# Patient Record
Sex: Female | Born: 2000 | Race: Black or African American | Hispanic: No | Marital: Single | Smoking: Never smoker
Health system: Southern US, Community
[De-identification: ages and names within clinical notes are randomized; demographics above are authoritative.]

## PROBLEM LIST (undated history)

## (undated) ENCOUNTER — Ambulatory Visit: Admission: EM | Payer: No Typology Code available for payment source | Source: Home / Self Care

## (undated) DIAGNOSIS — A499 Bacterial infection, unspecified: Secondary | ICD-10-CM

## (undated) DIAGNOSIS — Z789 Other specified health status: Secondary | ICD-10-CM

---
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
## 2020-10-31 ENCOUNTER — Other Ambulatory Visit: Payer: Self-pay

## 2020-11-07 ENCOUNTER — Encounter (HOSPITAL_COMMUNITY): Payer: Self-pay

## 2020-11-07 ENCOUNTER — Ambulatory Visit (HOSPITAL_COMMUNITY)
Admission: EM | Admit: 2020-11-07 | Discharge: 2020-11-07 | Disposition: A | Discharge status: Home / Self Care | Payer: Self-pay | Attending: Emergency Medicine | Admitting: Emergency Medicine

## 2020-11-07 ENCOUNTER — Other Ambulatory Visit: Payer: Self-pay

## 2020-11-07 DIAGNOSIS — R059 Cough, unspecified: Secondary | ICD-10-CM

## 2020-11-07 MED ORDER — ALBUTEROL SULFATE HFA 108 (90 BASE) MCG/ACT IN AERS
INHALATION_SPRAY | RESPIRATORY_TRACT | Status: AC
  Filled 2020-11-07: qty 6.7

## 2020-11-07 MED ORDER — ALBUTEROL SULFATE HFA 108 (90 BASE) MCG/ACT IN AERS
2.0000 | INHALATION_SPRAY | Freq: Once | RESPIRATORY_TRACT | Status: AC
  Administered 2020-11-07: 2 via RESPIRATORY_TRACT

## 2020-11-07 MED ORDER — BENZONATATE 100 MG PO CAPS: 100.0000 mg | ORAL_CAPSULE | Freq: Three times a day (TID) | ORAL | 0 refills | Status: DC | PRN

## 2020-11-07 MED ORDER — PREDNISONE 20 MG PO TABS: 40.0000 mg | ORAL_TABLET | Freq: Every day | ORAL | 0 refills | Status: AC

## 2020-11-07 NOTE — ED Provider Notes (Signed)
[facility]    CSN: [id number] Arrival date & time: 11/07/20  2979      History   Chief Complaint Chief Complaint  Patient presents with  . Cough    HPI Rebecca Gray is a 20 y.o. female.   Rebecca Gray presents with complaints of cough. She started with symptoms around 4/18 with cough and congestion, after being around a friend with similar illness. Congestion has improved but still with a cough which worsens in the afternoon/ evening. No fevers. No shortness of breath. Cough has been improving. Cough is now dry. No ear pain or sore throat. Has been using theraflu and another over the counter medication for her cough. Denies any previous similar. No history of asthma. Doesn't smoke.     ROS per HPI, negative if not otherwise mentioned.      Past Medical History:  Diagnosis Date  . Bacterial infection    in her blood  . Medical history non-contributory     There are no problems to display for this patient.   History reviewed. No pertinent surgical history.  OB History   No obstetric history on file.      Home Medications    Prior to Admission medications   Medication Sig Start Date End Date Taking? Authorizing Provider  benzonatate (TESSALON) 100 MG capsule Take 1-2 capsules (100-200 mg total) by mouth 3 (three) times daily as needed for cough. 11/07/20  Yes Aylee Littrell, Dorene Grebe B, NP  predniSONE (DELTASONE) 20 MG tablet Take 2 tablets (40 mg total) by mouth daily with breakfast for 5 days. 11/07/20 11/12/20 Yes Negin Hegg, Dorene Grebe B, NP  Cetirizine HCl 10 MG CAPS Take 1 capsule (10 mg total) by mouth daily for 15 days. 11/02/17 11/17/17  Wieters, Hallie C, PA-C  cetirizine-pseudoephedrine (ZYRTEC-D) 5-120 MG per tablet Take 1 tablet by mouth daily. 02/25/13   Gwyneth Sprout, MD  DiphenhydrAMINE HCl (BENADRYL ALLERGY PO) Take 1 tablet by mouth daily as needed (allergies).    [provider]  fluticasone (FLONASE) 50 MCG/ACT nasal spray Place 1 spray  into both nostrils daily for 7 days. 02/13/18 02/20/18  Linus Mako B, NP  ipratropium (ATROVENT) 0.06 % nasal spray Place 2 sprays into both nostrils 4 (four) times daily. 02/13/18   Georgetta Haber, NP  omeprazole (PRILOSEC) 20 MG capsule Take 1 capsule (20 mg total) by mouth daily. 07/18/19   Wieters, Hallie C, PA-C  ondansetron (ZOFRAN ODT) 4 MG disintegrating tablet Take 1 tablet (4 mg total) by mouth every 8 (eight) hours as needed for nausea or vomiting. 07/18/19   Wieters, Junius Creamer, PA-C    Family History History reviewed. No pertinent family history.  Social History Social History   Tobacco Use  . Smoking status: Passive Smoke Exposure - Never Smoker  . Smokeless tobacco: Never Used  Substance Use Topics  . Alcohol use: No  . Drug use: No     Allergies   Patient has no known allergies.   Review of Systems Review of Systems   Physical Exam Triage Vital Signs ED Triage Vitals  Enc Vitals Group     BP 11/07/20 1029 106/69     Pulse Rate 11/07/20 1029 76     Resp 11/07/20 1029 17     Temp 11/07/20 1029 98.1 F (36.7 C)     Temp Source 11/07/20 1029 Oral     SpO2 11/07/20 1029 100 %     Weight --      Height --  Head Circumference --      Peak Flow --      Pain Score 11/07/20 1030 2     Pain Loc --      Pain Edu? --      Excl. in GC? --    No data found.  Updated Vital Signs BP 106/69 (BP Location: Right Arm)   Pulse 76   Temp 98.1 F (36.7 C) (Oral)   Resp 17   LMP 10/26/2020   SpO2 100%   Visual Acuity Right Eye Distance:   Left Eye Distance:   Bilateral Distance:    Right Eye Near:   Left Eye Near:    Bilateral Near:     Physical Exam Constitutional:      General: She is not in acute distress.    Appearance: She is well-developed.  HENT:     Right Ear: Tympanic membrane and ear canal normal.     Left Ear: Tympanic membrane and ear canal normal.     Nose: Nose normal.     Mouth/Throat:     Mouth: Mucous membranes are moist.   Eyes:     Conjunctiva/sclera: Conjunctivae normal.     Pupils: Pupils are equal, round, and reactive to light.  Cardiovascular:     Rate and Rhythm: Normal rate.  Pulmonary:     Effort: Pulmonary effort is normal.     Breath sounds: Normal breath sounds.  Skin:    General: Skin is warm and dry.  Neurological:     Mental Status: She is alert and oriented to person, place, and time.      UC Treatments / Results  Labs (all labs ordered are listed, but only abnormal results are displayed) Labs Reviewed - No data to display  EKG   Radiology No results found.  Procedures Procedures (including critical care time)  Medications Ordered in UC Medications  albuterol (VENTOLIN HFA) 108 (90 Base) MCG/ACT inhaler 2 puff (has no administration in time range)    Initial Impression / Assessment and Plan / UC Course  I have reviewed the triage vital signs and the nursing notes.  Pertinent labs & imaging results that were available during my care of the patient were reviewed by me and considered in my medical decision making (see chart for details).     Non toxic. Benign physical exam.  Lungs clear here today. Vitals stable. No work of breathing. History and physical consistent with viral illness.  Supportive cares recommended. Return precautions provided. Patient verbalized understanding and agreeable to plan.   Final Clinical Impressions(s) / UC Diagnoses   Final diagnoses:  Cough     Discharge Instructions     Use of inhaler as needed for wheezing or shortness of breath.   Delsym may help with your cough, tessalon as needed may further help with your cough.  5 days of prednisone.  If symptoms worsen or do not improve in the next week to return to be seen or to follow up with your PCP.     ED Prescriptions    Medication Sig Dispense Auth. Provider   predniSONE (DELTASONE) 20 MG tablet Take 2 tablets (40 mg total) by mouth daily with breakfast for 5 days. 10 tablet Linus Mako B, NP   benzonatate (TESSALON) 100 MG capsule Take 1-2 capsules (100-200 mg total) by mouth 3 (three) times daily as needed for cough. 21 capsule Georgetta Haber, NP     PDMP not reviewed this encounter.   Linus Mako  B, NP 11/07/20 1047

## 2020-11-07 NOTE — Discharge Instructions (Addendum)
Use of inhaler as needed for wheezing or shortness of breath.   Delsym may help with your cough, tessalon as needed may further help with your cough.  5 days of prednisone.  If symptoms worsen or do not improve in the next week to return to be seen or to follow up with your PCP.

## 2020-11-07 NOTE — ED Triage Notes (Signed)
Pt presents with non productive cough and congestion X 3 weeks. 

## 2020-12-19 ENCOUNTER — Other Ambulatory Visit: Payer: Self-pay

## 2020-12-19 ENCOUNTER — Ambulatory Visit (HOSPITAL_COMMUNITY)
Admission: EM | Admit: 2020-12-19 | Discharge: 2020-12-19 | Disposition: A | Discharge status: Home / Self Care | Payer: Self-pay | Attending: Medical Oncology | Admitting: Medical Oncology

## 2020-12-19 ENCOUNTER — Encounter (HOSPITAL_COMMUNITY): Payer: Self-pay | Admitting: Emergency Medicine

## 2020-12-19 DIAGNOSIS — R051 Acute cough: Secondary | ICD-10-CM | POA: Insufficient documentation

## 2020-12-19 DIAGNOSIS — Z79899 Other long term (current) drug therapy: Secondary | ICD-10-CM | POA: Insufficient documentation

## 2020-12-19 DIAGNOSIS — Z7722 Contact with and (suspected) exposure to environmental tobacco smoke (acute) (chronic): Secondary | ICD-10-CM | POA: Insufficient documentation

## 2020-12-19 DIAGNOSIS — Z20822 Contact with and (suspected) exposure to covid-19: Secondary | ICD-10-CM | POA: Insufficient documentation

## 2020-12-19 DIAGNOSIS — J069 Acute upper respiratory infection, unspecified: Secondary | ICD-10-CM | POA: Insufficient documentation

## 2020-12-19 LAB — SARS CORONAVIRUS 2 (TAT 6-24 HRS): SARS Coronavirus 2: NEGATIVE

## 2020-12-19 MED ORDER — BENZONATATE 100 MG PO CAPS: 100.0000 mg | ORAL_CAPSULE | Freq: Three times a day (TID) | ORAL | 0 refills | Status: DC | PRN

## 2020-12-19 MED ORDER — FLUTICASONE PROPIONATE 50 MCG/ACT NA SUSP
2.0000 | Freq: Every day | NASAL | 0 refills | Status: DC
Start: 2020-12-19 — End: 2022-08-10

## 2020-12-19 NOTE — ED Provider Notes (Signed)
[facility]    CSN: [id number] Arrival date & time: 12/19/20  6834      History   Chief Complaint Chief Complaint  Patient presents with   Cough   Sore Throat   Nasal Congestion    HPI Rebecca Gray is a 20 y.o. female.   HPI  Cold Symptoms: Patient states that she has had a cough, sore throat and nasal congestion for the past 1 day.  Her best friend has similar symptoms without known diagnosis.  She has tried cough drops for symptoms without much relief.  No chest pain, shortness of breath, wheezing, fevers, vomiting or diarrhea.  Past Medical History:  Diagnosis Date   Bacterial infection    in her blood   Medical history non-contributory     There are no problems to display for this patient.   History reviewed. No pertinent surgical history.  OB History   No obstetric history on file.      Home Medications    Prior to Admission medications   Medication Sig Start Date End Date Taking? Authorizing Provider  benzonatate (TESSALON) 100 MG capsule Take 1-2 capsules (100-200 mg total) by mouth 3 (three) times daily as needed for cough. 11/07/20   Georgetta Haber, NP  Cetirizine HCl 10 MG CAPS Take 1 capsule (10 mg total) by mouth daily for 15 days. 11/02/17 11/17/17  Wieters, Hallie C, PA-C  cetirizine-pseudoephedrine (ZYRTEC-D) 5-120 MG per tablet Take 1 tablet by mouth daily. 02/25/13   Gwyneth Sprout, MD  DiphenhydrAMINE HCl (BENADRYL ALLERGY PO) Take 1 tablet by mouth daily as needed (allergies).    [provider]  fluticasone (FLONASE) 50 MCG/ACT nasal spray Place 1 spray into both nostrils daily for 7 days. 02/13/18 02/20/18  Linus Mako B, NP  ipratropium (ATROVENT) 0.06 % nasal spray Place 2 sprays into both nostrils 4 (four) times daily. 02/13/18   Georgetta Haber, NP  omeprazole (PRILOSEC) 20 MG capsule Take 1 capsule (20 mg total) by mouth daily. 07/18/19   Wieters, Hallie C, PA-C  ondansetron (ZOFRAN ODT) 4 MG disintegrating tablet  Take 1 tablet (4 mg total) by mouth every 8 (eight) hours as needed for nausea or vomiting. 07/18/19   Wieters, Junius Creamer, PA-C    Family History History reviewed. No pertinent family history.  Social History Social History   Tobacco Use   Smoking status: Passive Smoke Exposure - Never Smoker   Smokeless tobacco: Never  Substance Use Topics   Alcohol use: No   Drug use: No     Allergies   Patient has no known allergies.   Review of Systems Review of Systems  As stated above in HPI Physical Exam Triage Vital Signs ED Triage Vitals  Enc Vitals Group     BP 12/19/20 0905 104/72     Pulse Rate 12/19/20 0905 94     Resp 12/19/20 0905 17     Temp 12/19/20 0905 98.5 F (36.9 C)     Temp Source 12/19/20 0905 Oral     SpO2 12/19/20 0905 100 %     Weight --      Height --      Head Circumference --      Peak Flow --      Pain Score 12/19/20 0903 0     Pain Loc --      Pain Edu? --      Excl. in GC? --    No data found.  Updated Vital  Signs BP 104/72 (BP Location: Left Arm)   Pulse 94   Temp 98.5 F (36.9 C) (Oral)   Resp 17   LMP 11/24/2020   SpO2 100%    Physical Exam Vitals and nursing note reviewed.  Constitutional:      General: She is not in acute distress.    Appearance: She is well-developed. She is not ill-appearing, toxic-appearing or diaphoretic.  HENT:     Head: Normocephalic and atraumatic.     Right Ear: Tympanic membrane and ear canal normal. No tenderness. No middle ear effusion. Tympanic membrane is not erythematous.     Left Ear: Tympanic membrane and ear canal normal. No tenderness.  No middle ear effusion. Tympanic membrane is not erythematous.     Nose: Congestion and rhinorrhea present.     Mouth/Throat:     Mouth: Mucous membranes are moist.     Pharynx: Oropharynx is clear. Uvula midline. No pharyngeal swelling, oropharyngeal exudate, posterior oropharyngeal erythema or uvula swelling.     Tonsils: No tonsillar exudate or tonsillar  abscesses.  Eyes:     Conjunctiva/sclera: Conjunctivae normal.     Pupils: Pupils are equal, round, and reactive to light.  Cardiovascular:     Rate and Rhythm: Normal rate and regular rhythm.     Heart sounds: Normal heart sounds.  Pulmonary:     Effort: Pulmonary effort is normal.     Breath sounds: Normal breath sounds.  Musculoskeletal:     Cervical back: Normal range of motion and neck supple.  Lymphadenopathy:     Cervical: No cervical adenopathy.  Skin:    General: Skin is warm.  Neurological:     Mental Status: She is alert and oriented to person, place, and time.     UC Treatments / Results  Labs (all labs ordered are listed, but only abnormal results are displayed) Labs Reviewed - No data to display  EKG   Radiology No results found.  Procedures Procedures (including critical care time)  Medications Ordered in UC Medications - No data to display  Initial Impression / Assessment and Plan / UC Course  I have reviewed the triage vital signs and the nursing notes.  Pertinent labs & imaging results that were available during my care of the patient were reviewed by me and considered in my medical decision making (see chart for details).     New.  Likely viral in nature which I discussed with patient.  Sending in [place] and [place] for her to use for now.  Rest, hydration with water, healthy diet. COVID test pending.  Discussed red flag signs and symptoms. Final Clinical Impressions(s) / UC Diagnoses   Final diagnoses:  None   Discharge Instructions   None    ED Prescriptions   None    PDMP not reviewed this encounter.   Rushie Chestnut, [place] 12/19/20 0945

## 2020-12-19 NOTE — ED Triage Notes (Signed)
Pt presents with cough, congestion, and sore throat that started yesterday. State using cough drop with minimal relief.

## 2022-03-11 ENCOUNTER — Other Ambulatory Visit: Payer: Self-pay

## 2022-03-11 ENCOUNTER — Ambulatory Visit
Admission: EM | Admit: 2022-03-11 | Discharge: 2022-03-11 | Disposition: A | Discharge status: Home / Self Care | Payer: BC Managed Care – PPO | Attending: Emergency Medicine | Admitting: Emergency Medicine

## 2022-03-11 ENCOUNTER — Encounter: Payer: Self-pay | Admitting: Emergency Medicine

## 2022-03-11 DIAGNOSIS — J029 Acute pharyngitis, unspecified: Secondary | ICD-10-CM

## 2022-03-11 LAB — POCT RAPID STREP A (OFFICE): Rapid Strep A Screen: NEGATIVE

## 2022-03-11 NOTE — ED Provider Notes (Signed)
[name]    CSN: [id number] Arrival date & time: 03/11/22  0913      History   Chief Complaint Chief Complaint  Patient presents with   Sore Throat    HPI PERSIA LINTNER is a 21 y.o. female.   Patient presents with sore throat and mild intermittent left ear pain beginning 1 day ago and sniffling beginning this morning.  No known sick contacts.  Tolerating food and liquids.  Has attempted use of NyQuil which has been somewhat effective.  Denies fever, chills, body aches, coughing, shortness of breath, wheezing, nausea, vomiting or diarrhea.  No pertinent medical history.  Past Medical History:  Diagnosis Date   Bacterial infection    in her blood   Medical history non-contributory     There are no problems to display for this patient.   History reviewed. No pertinent surgical history.  OB History   No obstetric history on file.      Home Medications    Prior to Admission medications   Medication Sig Start Date End Date Taking? Authorizing Provider  benzonatate (TESSALON) 100 MG capsule Take 1-2 capsules (100-200 mg total) by mouth 3 (three) times daily as needed for cough. 12/19/20   Rushie Chestnut, PA-C  Cetirizine HCl 10 MG CAPS Take 1 capsule (10 mg total) by mouth daily for 15 days. 11/02/17 11/17/17  Wieters, Hallie C, PA-C  cetirizine-pseudoephedrine (ZYRTEC-D) 5-120 MG per tablet Take 1 tablet by mouth daily. 02/25/13   Gwyneth Sprout, MD  DiphenhydrAMINE HCl (BENADRYL ALLERGY PO) Take 1 tablet by mouth daily as needed (allergies).    [provider]  fluticasone (FLONASE) 50 MCG/ACT nasal spray Place 2 sprays into both nostrils daily. 12/19/20   Rushie Chestnut, PA-C  ipratropium (ATROVENT) 0.06 % nasal spray Place 2 sprays into both nostrils 4 (four) times daily. 02/13/18   Georgetta Haber, NP  omeprazole (PRILOSEC) 20 MG capsule Take 1 capsule (20 mg total) by mouth daily. 07/18/19   Wieters, Hallie C, PA-C  ondansetron (ZOFRAN ODT)  4 MG disintegrating tablet Take 1 tablet (4 mg total) by mouth every 8 (eight) hours as needed for nausea or vomiting. 07/18/19   Wieters, Junius Creamer, PA-C    Family History History reviewed. No pertinent family history.  Social History Social History   Tobacco Use   Smoking status: Passive Smoke Exposure - Never Smoker   Smokeless tobacco: Never  Substance Use Topics   Alcohol use: No   Drug use: No     Allergies   Patient has no known allergies.   Review of Systems Review of Systems Defer to hpi    Physical Exam Triage Vital Signs ED Triage Vitals  Enc Vitals Group     BP 03/11/22 0920 117/81     Pulse Rate 03/11/22 0920 88     Resp 03/11/22 0920 16     Temp 03/11/22 0920 99.4 F (37.4 C)     Temp Source 03/11/22 0920 Oral     SpO2 03/11/22 0920 98 %     Weight 03/11/22 0923 135 lb (61.2 kg)     Height --      Head Circumference --      Peak Flow --      Pain Score 03/11/22 0923 8     Pain Loc --      Pain Edu? --      Excl. in GC? --    No data found.  Updated Vital  Signs BP 117/81 (BP Location: Left Arm)   Pulse 88   Temp 99.4 F (37.4 C) (Oral)   Resp 16   Wt 135 lb (61.2 kg)   LMP 03/08/2022 (Exact Date)   SpO2 98%   Visual Acuity Right Eye Distance:   Left Eye Distance:   Bilateral Distance:    Right Eye Near:   Left Eye Near:    Bilateral Near:     Physical Exam Constitutional:      Appearance: She is well-developed.  HENT:     Head: Normocephalic.     Right Ear: Tympanic membrane and ear canal normal.     Left Ear: Tympanic membrane and ear canal normal.     Nose: Congestion present. No rhinorrhea.     Mouth/Throat:     Mouth: Mucous membranes are moist.     Pharynx: No posterior oropharyngeal erythema.     Tonsils: No tonsillar exudate. 1+ on the right. 1+ on the left.  Cardiovascular:     Rate and Rhythm: Regular rhythm.     Heart sounds: Normal heart sounds.  Pulmonary:     Effort: Pulmonary effort is normal.     Breath  sounds: Normal breath sounds.  Musculoskeletal:     Cervical back: Normal range of motion and neck supple.  Skin:    General: Skin is warm and dry.  Neurological:     General: No focal deficit present.     Mental Status: She is alert and oriented to person, place, and time.      UC Treatments / Results  Labs (all labs ordered are listed, but only abnormal results are displayed) Labs Reviewed  POCT RAPID STREP A (OFFICE)    EKG   Radiology No results found.  Procedures Procedures (including critical care time)  Medications Ordered in UC Medications - No data to display  Initial Impression / Assessment and Plan / UC Course  I have reviewed the triage vital signs and the nursing notes.  Pertinent labs & imaging results that were available during my care of the patient were reviewed by me and considered in my medical decision making (see chart for details).  Viral pharyngitis  Patient is in no signs of distress nor toxic appearing.  Vital signs are stable.  Rapid strep test is negative, discussed findings with patient  May use additional over-the-counter medications and additional measures as needed for supportive care.  May follow-up with urgent care as needed if symptoms persist or worsen.  Note given.   Final Clinical Impressions(s) / UC Diagnoses   Final diagnoses:  Viral pharyngitis     Discharge Instructions      On your exam your throat is not red, your tonsils are enlarged but there are no Claudeen Leason patches  Your strep test is negative for bacteria to the throat and your symptoms today are most likely being caused by a virus meaning they will need to resolve with time    You can take Tylenol and/or Ibuprofen as needed for fever reduction and pain relief.   For sore throat: try warm salt water gargles, cepacol lozenges, throat spray, warm tea or water with lemon/honey, popsicles or ice, or OTC cold relief medicine for throat discomfort.   For congestion:  take a daily anti-histamine like Zyrtec, Claritin, and a oral decongestant, such as pseudoephedrine.  You can also use Flonase 1-2 sprays in each nostril daily.   It is important to stay hydrated: drink plenty of fluids (water, gatorade/powerade/pedialyte, juices,  or teas) to keep your throat moisturized and help further relieve irritation/discomfort.    ED Prescriptions   None    PDMP not reviewed this encounter.   Valinda Hoar, [place] 03/11/22 [phone]

## 2022-03-11 NOTE — ED Triage Notes (Signed)
Patient c/o sore throat x 1 day.   Patient denies fever.   Patient endorses painful swallowing.   Patient has taken Nyquil and cough drops with no relief of symptoms.

## 2022-03-11 NOTE — Discharge Instructions (Signed)
On your exam your throat is not red, your tonsils are enlarged but there are no Anshul Meddings patches  Your strep test is negative for bacteria to the throat and your symptoms today are most likely being caused by a virus meaning they will need to resolve with time    You can take Tylenol and/or Ibuprofen as needed for fever reduction and pain relief.   For sore throat: try warm salt water gargles, cepacol lozenges, throat spray, warm tea or water with lemon/honey, popsicles or ice, or OTC cold relief medicine for throat discomfort.   For congestion: take a daily anti-histamine like Zyrtec, Claritin, and a oral decongestant, such as pseudoephedrine.  You can also use Flonase 1-2 sprays in each nostril daily.   It is important to stay hydrated: drink plenty of fluids (water, gatorade/powerade/pedialyte, juices, or teas) to keep your throat moisturized and help further relieve irritation/discomfort.

## 2022-03-16 ENCOUNTER — Ambulatory Visit
Admission: EM | Admit: 2022-03-16 | Discharge: 2022-03-16 | Disposition: A | Discharge status: Home / Self Care | Payer: BC Managed Care – PPO | Attending: Urgent Care | Admitting: Urgent Care

## 2022-03-16 ENCOUNTER — Encounter: Payer: Self-pay | Admitting: Emergency Medicine

## 2022-03-16 ENCOUNTER — Other Ambulatory Visit: Payer: Self-pay

## 2022-03-16 DIAGNOSIS — R051 Acute cough: Secondary | ICD-10-CM

## 2022-03-16 DIAGNOSIS — J019 Acute sinusitis, unspecified: Secondary | ICD-10-CM

## 2022-03-16 DIAGNOSIS — B9689 Other specified bacterial agents as the cause of diseases classified elsewhere: Secondary | ICD-10-CM | POA: Diagnosis not present

## 2022-03-16 MED ORDER — AMOXICILLIN-POT CLAVULANATE 875-125 MG PO TABS: 1.0000 | ORAL_TABLET | Freq: Two times a day (BID) | ORAL | 0 refills | Status: AC

## 2022-03-16 MED ORDER — BENZONATATE 100 MG PO CAPS: ORAL_CAPSULE | ORAL | 0 refills | Status: DC

## 2022-03-16 NOTE — ED Provider Notes (Signed)
[name]    CSN: [id number] Arrival date & time: 03/16/22  8101      History   Chief Complaint Chief Complaint  Patient presents with   Cough   Nasal Congestion    HPI Rebecca Gray is a 21 y.o. female.    Cough   Patient presents to urgent care with report of URI symptoms x3 days.  Reports productive cough with thick sputum, nasal congestion.  She states that she was seen 5 days ago (9/7) and treated for viral infection.  Reports that symptoms on that day have resolved: These symptoms are new.  Denies fever measured at home.  Denies sore throat.  Denies myalgias.  Reports green-colored drainage from her nose.  Taking DayQuil and TheraFlu PM with partial relief of symptoms.  Past Medical History:  Diagnosis Date   Bacterial infection    in her blood   Medical history non-contributory     There are no problems to display for this patient.   History reviewed. No pertinent surgical history.  OB History   No obstetric history on file.      Home Medications    Prior to Admission medications   Medication Sig Start Date End Date Taking? Authorizing Provider  benzonatate (TESSALON) 100 MG capsule Take 1-2 capsules (100-200 mg total) by mouth 3 (three) times daily as needed for cough. 12/19/20   Rushie Chestnut, PA-C  Cetirizine HCl 10 MG CAPS Take 1 capsule (10 mg total) by mouth daily for 15 days. 11/02/17 11/17/17  Wieters, Hallie C, PA-C  cetirizine-pseudoephedrine (ZYRTEC-D) 5-120 MG per tablet Take 1 tablet by mouth daily. 02/25/13   Gwyneth Sprout, MD  DiphenhydrAMINE HCl (BENADRYL ALLERGY PO) Take 1 tablet by mouth daily as needed (allergies).    [provider]  fluticasone (FLONASE) 50 MCG/ACT nasal spray Place 2 sprays into both nostrils daily. 12/19/20   Rushie Chestnut, PA-C  ipratropium (ATROVENT) 0.06 % nasal spray Place 2 sprays into both nostrils 4 (four) times daily. 02/13/18   Georgetta Haber, NP  omeprazole  (PRILOSEC) 20 MG capsule Take 1 capsule (20 mg total) by mouth daily. 07/18/19   Wieters, Hallie C, PA-C  ondansetron (ZOFRAN ODT) 4 MG disintegrating tablet Take 1 tablet (4 mg total) by mouth every 8 (eight) hours as needed for nausea or vomiting. 07/18/19   Wieters, Junius Creamer, PA-C    Family History History reviewed. No pertinent family history.  Social History Social History   Tobacco Use   Smoking status: Passive Smoke Exposure - Never Smoker   Smokeless tobacco: Never  Substance Use Topics   Alcohol use: No   Drug use: No     Allergies   Patient has no known allergies.   Review of Systems Review of Systems   Physical Exam Triage Vital Signs ED Triage Vitals  Enc Vitals Group     BP 03/16/22 0850 112/79     Pulse Rate 03/16/22 0850 87     Resp 03/16/22 0850 15     Temp 03/16/22 0850 99 F (37.2 C)     Temp Source 03/16/22 0850 Oral     SpO2 03/16/22 0850 97 %     Weight --      Height --      Head Circumference --      Peak Flow --      Pain Score 03/16/22 0855 0     Pain Loc --      Pain Edu? --  Excl. in GC? --    No data found.  Updated Vital Signs BP 112/79 (BP Location: Left Arm)   Pulse 87   Temp 99 F (37.2 C) (Oral)   Resp 15   LMP 03/08/2022 (Exact Date)   SpO2 97%   Visual Acuity Right Eye Distance:   Left Eye Distance:   Bilateral Distance:    Right Eye Near:   Left Eye Near:    Bilateral Near:     Physical Exam Vitals reviewed.  Constitutional:      Appearance: Normal appearance.  HENT:     Head: Normocephalic.     Right Ear: Tympanic membrane, ear canal and external ear normal.     Left Ear: Tympanic membrane, ear canal and external ear normal.     Nose: Congestion present.     Mouth/Throat:     Mouth: Mucous membranes are moist.     Pharynx: No oropharyngeal exudate or posterior oropharyngeal erythema.  Eyes:     Conjunctiva/sclera: Conjunctivae normal.     Pupils: Pupils are equal, round, and reactive to light.   Cardiovascular:     Rate and Rhythm: Normal rate and regular rhythm.     Pulses: Normal pulses.     Heart sounds: Normal heart sounds.  Pulmonary:     Effort: Pulmonary effort is normal.     Breath sounds: Normal breath sounds.  Musculoskeletal:     Cervical back: Normal range of motion and neck supple.  Lymphadenopathy:     Cervical: No cervical adenopathy.  Skin:    General: Skin is warm and dry.  Neurological:     General: No focal deficit present.     Mental Status: She is alert and oriented to person, place, and time.  Psychiatric:        Mood and Affect: Mood normal.        Behavior: Behavior normal.      UC Treatments / Results  Labs (all labs ordered are listed, but only abnormal results are displayed) Labs Reviewed - No data to display  EKG   Radiology No results found.  Procedures Procedures (including critical care time)  Medications Ordered in UC Medications - No data to display  Initial Impression / Assessment and Plan / UC Course  I have reviewed the triage vital signs and the nursing notes.  Pertinent labs & imaging results that were available during my care of the patient were reviewed by me and considered in my medical decision making (see chart for details).   Physical exam is unremarkable.  Lungs CTAB.  Suspect bacterial rhinosinusitis secondary to recent viral URI.  Will treat with Augmentin x7 days and benzonatate for cough.  Please follow-up with your primary care provider if symptoms do not respond to treatment.   Final Clinical Impressions(s) / UC Diagnoses   Final diagnoses:  None   Discharge Instructions   None    ED Prescriptions   None    PDMP not reviewed this encounter.   Charma Igo, [place] 03/16/22 [phone]

## 2022-03-16 NOTE — ED Triage Notes (Signed)
Patient c/o productive cough w/ " thick" sputum x 3 days.   Patient c/o nasal congestion x 4 days.   Patient was seen on 9/7 and states she was seen for viral infection, symptoms from that episode resolved before these new symptoms occurred.   Patient denies fever at home. Patient denies sore throat or generalized body aches.   Patient endorses green nasal drainage.   Patient has taken Nyquil and TheraFlu with no relief of symptoms.

## 2022-03-16 NOTE — Discharge Instructions (Signed)
Follow-up with your primary care provider or here if your symptoms do not resolve.

## 2022-05-03 ENCOUNTER — Ambulatory Visit
Admission: EM | Admit: 2022-05-03 | Discharge: 2022-05-03 | Disposition: A | Discharge status: Home / Self Care | Payer: BC Managed Care – PPO | Attending: Emergency Medicine | Admitting: Emergency Medicine

## 2022-05-03 DIAGNOSIS — R051 Acute cough: Secondary | ICD-10-CM | POA: Diagnosis present

## 2022-05-03 DIAGNOSIS — B349 Viral infection, unspecified: Secondary | ICD-10-CM | POA: Insufficient documentation

## 2022-05-03 DIAGNOSIS — Z1152 Encounter for screening for COVID-19: Secondary | ICD-10-CM | POA: Diagnosis not present

## 2022-05-03 DIAGNOSIS — R059 Cough, unspecified: Secondary | ICD-10-CM | POA: Insufficient documentation

## 2022-05-03 NOTE — ED Provider Notes (Signed)
[facility]    CSN: [id number] Arrival date & time: 05/03/22  1150      History   Chief Complaint Chief Complaint  Patient presents with   Cough    HPI Rebecca Gray is a 21 y.o. female.  Patient presents with 2-day history of congestion and cough.  She thinks she may have had a fever yesterday.  No rash, sore throat, shortness of breath, vomiting, diarrhea, or other symptoms.  No OTC medications taken today.  No pertinent medical history.  The history is provided by the patient and medical records.    Past Medical History:  Diagnosis Date   Bacterial infection    in her blood   Medical history non-contributory     Patient Active Problem List   Diagnosis Date Noted   Cough 05/03/2022    History reviewed. No pertinent surgical history.  OB History   No obstetric history on file.      Home Medications    Prior to Admission medications   Medication Sig Start Date End Date Taking? Authorizing Provider  benzonatate (TESSALON) 100 MG capsule Take 1-2 tablets 3 times a day as needed for cough 03/16/22   Immordino, Annie Main, FNP  Cetirizine HCl 10 MG CAPS Take 1 capsule (10 mg total) by mouth daily for 15 days. 11/02/17 11/17/17  Wieters, Hallie C, PA-C  cetirizine-pseudoephedrine (ZYRTEC-D) 5-120 MG per tablet Take 1 tablet by mouth daily. 02/25/13   Blanchie Dessert, MD  DiphenhydrAMINE HCl (BENADRYL ALLERGY PO) Take 1 tablet by mouth daily as needed (allergies).    [provider]  fluticasone (FLONASE) 50 MCG/ACT nasal spray Place 2 sprays into both nostrils daily. 12/19/20   Hughie Closs, PA-C  ipratropium (ATROVENT) 0.06 % nasal spray Place 2 sprays into both nostrils 4 (four) times daily. 02/13/18   Zigmund Gottron, NP  omeprazole (PRILOSEC) 20 MG capsule Take 1 capsule (20 mg total) by mouth daily. 07/18/19   Wieters, Hallie C, PA-C  ondansetron (ZOFRAN ODT) 4 MG disintegrating tablet Take 1 tablet (4 mg total) by mouth every 8 (eight) hours  as needed for nausea or vomiting. 07/18/19   Wieters, Elesa Hacker, PA-C    Family History History reviewed. No pertinent family history.  Social History Social History   Tobacco Use   Smoking status: Never    Passive exposure: Yes   Smokeless tobacco: Never  Substance Use Topics   Alcohol use: Yes    Comment: occasional   Drug use: No     Allergies   Patient has no known allergies.   Review of Systems Review of Systems  Constitutional:  Positive for fever. Negative for chills.  HENT:  Positive for congestion. Negative for ear pain and sore throat.   Respiratory:  Positive for cough. Negative for shortness of breath.   Cardiovascular:  Negative for chest pain and palpitations.  Gastrointestinal:  Negative for diarrhea and vomiting.  Skin:  Negative for rash.  All other systems reviewed and are negative.    Physical Exam Triage Vital Signs ED Triage Vitals  Enc Vitals Group     BP --      Pulse Rate 05/03/22 1249 96     Resp 05/03/22 1249 18     Temp 05/03/22 1249 97.9 F (36.6 C)     Temp src --      SpO2 05/03/22 1249 100 %     Weight 05/03/22 1301 135 lb (61.2 kg)     Height 05/03/22  1301 5\' 3"  (1.6 m)     Head Circumference --      Peak Flow --      Pain Score --      Pain Loc --      Pain Edu? --      Excl. in [place]? --    No data found.  Updated Vital Signs BP 105/73   Pulse 96   Temp 97.9 F (36.6 C)   Resp 18   Ht 5\' 3"  (1.6 m)   Wt 135 lb (61.2 kg)   LMP 05/02/2022   SpO2 100%   BMI 23.91 kg/m   Visual Acuity Right Eye Distance:   Left Eye Distance:   Bilateral Distance:    Right Eye Near:   Left Eye Near:    Bilateral Near:     Physical Exam Vitals and nursing note reviewed.  Constitutional:      General: She is not in acute distress.    Appearance: Normal appearance. She is well-developed. She is not ill-appearing.  HENT:     Right Ear: Tympanic membrane normal.     Left Ear: Tympanic membrane normal.     Nose: Nose normal.      Mouth/Throat:     Mouth: Mucous membranes are moist.     Pharynx: Oropharynx is clear.  Cardiovascular:     Rate and Rhythm: Normal rate and regular rhythm.     Heart sounds: Normal heart sounds.  Pulmonary:     Effort: Pulmonary effort is normal. No respiratory distress.     Breath sounds: Normal breath sounds.  Musculoskeletal:     Cervical back: Neck supple.  Skin:    General: Skin is warm and dry.  Neurological:     Mental Status: She is alert.  Psychiatric:        Mood and Affect: Mood normal.        Behavior: Behavior normal.      UC Treatments / Results  Labs (all labs ordered are listed, but only abnormal results are displayed) Labs Reviewed  SARS CORONAVIRUS 2 (TAT 6-24 HRS)    EKG   Radiology No results found.  Procedures Procedures (including critical care time)  Medications Ordered in UC Medications - No data to display  Initial Impression / Assessment and Plan / UC Course  I have reviewed the triage vital signs and the nursing notes.  Pertinent labs & imaging results that were available during my care of the patient were reviewed by me and considered in my medical decision making (see chart for details).    Cough, Viral illness.  COVID pending.  Discussed symptomatic treatment including Tylenol or ibuprofen, rest, hydration.  Instructed patient to follow up with her PCP if symptoms are not improving.  She agrees to plan of care.  Final Clinical Impressions(s) / UC Diagnoses   Final diagnoses:  Acute cough  Viral illness     Discharge Instructions      Your COVID test is pending.  Take Tylenol or ibuprofen as needed for fever or discomfort.  Rest and keep yourself hydrated.    Follow-up with your primary care provider if your symptoms are not improving.         ED Prescriptions   None    PDMP not reviewed this encounter.   Sharion Balloon, NP 05/03/22 1319

## 2022-05-03 NOTE — ED Triage Notes (Signed)
Patient to Urgent Care with complaints of cough and congestion that started over the weekend. Possible fever yesterday. Describes cough as productive with clear phlegm.   States that her niece is sick with the same symptoms.

## 2022-05-03 NOTE — Discharge Instructions (Addendum)
Your COVID test is pending.    Take Tylenol or ibuprofen as needed for fever or discomfort.  Rest and keep yourself hydrated.    Follow-up with your primary care provider if your symptoms are not improving.     

## 2022-05-04 LAB — SARS CORONAVIRUS 2 (TAT 6-24 HRS): SARS Coronavirus 2: NEGATIVE

## 2022-08-10 ENCOUNTER — Ambulatory Visit (HOSPITAL_COMMUNITY)
Admission: EM | Admit: 2022-08-10 | Discharge: 2022-08-10 | Disposition: A | Discharge status: Home / Self Care | Payer: BC Managed Care – PPO | Attending: Family Medicine | Admitting: Family Medicine

## 2022-08-10 ENCOUNTER — Encounter (HOSPITAL_COMMUNITY): Payer: Self-pay

## 2022-08-10 DIAGNOSIS — R109 Unspecified abdominal pain: Secondary | ICD-10-CM

## 2022-08-10 MED ORDER — OMEPRAZOLE 20 MG PO CPDR: 20.0000 mg | DELAYED_RELEASE_CAPSULE | Freq: Every day | ORAL | 0 refills | Status: DC

## 2022-08-10 NOTE — ED Provider Notes (Signed)
[place]    CSN: [id number] Arrival date & time: 08/10/22  1251      History   Chief Complaint Chief Complaint  Patient presents with   Abdominal Pain   Nausea   Diarrhea    HPI Rebecca Gray is a 22 y.o. female.   Patient is here for abd pain that started several hrs ago. Was her whole stomach.  It was sharp in nature.  Still present, but improved.  She had nausea, no vomiting.  That did resolve. She did have loose stools.  No fevers/chills.  She did have a slight runny nose, but that resolved.  That happened after eating lunch, sandwich.  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         Past Medical History:  Diagnosis Date   Bacterial infection    in her blood   Medical history non-contributory     Patient Active Problem List   Diagnosis Date Noted   Cough 05/03/2022    History reviewed. No pertinent surgical history.  OB History   No obstetric history on file.      Home Medications    Prior to Admission medications   Medication Sig Start Date End Date Taking? Authorizing Provider  benzonatate (TESSALON) 100 MG capsule Take 1-2 tablets 3 times a day as needed for cough 03/16/22   Immordino, Annie Main, FNP  Cetirizine HCl 10 MG CAPS Take 1 capsule (10 mg total) by mouth daily for 15 days. 11/02/17 11/17/17  Wieters, Hallie C, PA-C  cetirizine-pseudoephedrine (ZYRTEC-D) 5-120 MG per tablet Take 1 tablet by mouth daily. 02/25/13   Blanchie Dessert, MD  DiphenhydrAMINE HCl (BENADRYL ALLERGY PO) Take 1 tablet by mouth daily as needed (allergies).    [provider]  fluticasone (FLONASE) 50 MCG/ACT nasal spray Place 2 sprays into both nostrils daily. 12/19/20   Hughie Closs, PA-C  ipratropium (ATROVENT) 0.06 % nasal spray Place 2 sprays into both nostrils 4 (four) times daily. 02/13/18   Zigmund Gottron, NP  omeprazole (PRILOSEC) 20 MG capsule Take 1 capsule (20 mg total) by mouth  daily. 07/18/19   Wieters, Hallie C, PA-C  ondansetron (ZOFRAN ODT) 4 MG disintegrating tablet Take 1 tablet (4 mg total) by mouth every 8 (eight) hours as needed for nausea or vomiting. 07/18/19   Wieters, Elesa Hacker, PA-C    Family History History reviewed. No pertinent family history.  Social History Social History   Tobacco Use   Smoking status: Never    Passive exposure: Yes   Smokeless tobacco: Never  Vaping Use   Vaping Use: Never used  Substance Use Topics   Alcohol use: Yes    Comment: occasional   Drug use: No     Allergies   Shellfish allergy   Review of Systems Review of Systems  Constitutional: Negative.   HENT: Negative.    Respiratory: Negative.    Cardiovascular: Negative.   Gastrointestinal:  Positive for abdominal pain, diarrhea and nausea.  Genitourinary: Negative.   Musculoskeletal: Negative.   Skin: Negative.      Physical Exam Triage Vital Signs ED Triage Vitals  Enc Vitals Group     BP 08/10/22 1423 128/82     Pulse Rate 08/10/22 1423 66     Resp 08/10/22 1423 16     Temp 08/10/22 1423 98.2 F (36.8 C)     Temp Source 08/10/22 1423 Oral  SpO2 08/10/22 1423 98 %     Weight --      Height --      Head Circumference --      Peak Flow --      Pain Score 08/10/22 1425 5     Pain Loc --      Pain Edu? --      Excl. in [place]? --    No data found.  Updated Vital Signs BP 128/82 (BP Location: Left Arm)   Pulse 66   Temp 98.2 F (36.8 C) (Oral)   Resp 16   LMP 07/28/2022   SpO2 98%   Visual Acuity Right Eye Distance:   Left Eye Distance:   Bilateral Distance:    Right Eye Near:   Left Eye Near:    Bilateral Near:     Physical Exam Constitutional:      Appearance: She is well-developed.  Cardiovascular:     Rate and Rhythm: Normal rate and regular rhythm.  Pulmonary:     Effort: Pulmonary effort is normal.     Breath sounds: Normal breath sounds.  Abdominal:     General: Bowel sounds are normal.     Palpations: Abdomen  is soft.     Tenderness: There is abdominal tenderness in the epigastric area. There is no guarding or rebound.  Skin:    General: Skin is warm.  Neurological:     Mental Status: She is alert.      UC Treatments / Results  Labs (all labs ordered are listed, but only abnormal results are displayed) Labs Reviewed - No data to display  EKG   Radiology No results found.  Procedures Procedures (including critical care time)  Medications Ordered in UC Medications - No data to display  Initial Impression / Assessment and Plan / UC Course  I have reviewed the triage vital signs and the nursing notes.  Pertinent labs & imaging results that were available during my care of the patient were reviewed by me and considered in my medical decision making (see chart for details).    Final Clinical Impressions(s) / UC Diagnoses   Final diagnoses:  Abdominal pain, unspecified abdominal location     Discharge Instructions      You were seen today for abdominal pain and nausea, which is improved.  This could be related to shrimp intake, or some other food causing upset stomach.  I have sent out omeprazole to take daily for your stomach upset.  If you have worsening pain, along with nausea, vomiting, fever or chills, then please return or go to the ER for further testing.     ED Prescriptions     Medication Sig Dispense Auth. Provider   omeprazole (PRILOSEC) 20 MG capsule Take 1 capsule (20 mg total) by mouth daily for 14 days. 14 capsule Rondel Oh, MD      PDMP not reviewed this encounter.   Rondel Oh, MD 08/10/22 [phone]

## 2022-08-10 NOTE — Discharge Instructions (Addendum)
You were seen today for abdominal pain and nausea, which is improved.  This could be related to shrimp intake, or some other food causing upset stomach.  I have sent out omeprazole to take daily for your stomach upset.  If you have worsening pain, along with nausea, vomiting, fever or chills, then please return or go to the ER for further testing.

## 2022-08-10 NOTE — ED Triage Notes (Signed)
Patient c/o abdominal cramping that started today. Diarrhea started 2 days ago. Patient states she was nauseated earlier, but not at this time.  Patient has not taken any medications for her symptoms.

## 2022-09-03 ENCOUNTER — Ambulatory Visit: Payer: BLUE CROSS/BLUE SHIELD | Admitting: Podiatry

## 2022-09-03 ENCOUNTER — Ambulatory Visit (INDEPENDENT_AMBULATORY_CARE_PROVIDER_SITE_OTHER): Payer: BLUE CROSS/BLUE SHIELD

## 2022-09-03 ENCOUNTER — Encounter: Payer: Self-pay | Admitting: Podiatry

## 2022-09-03 DIAGNOSIS — Q72891 Other reduction defects of right lower limb: Secondary | ICD-10-CM | POA: Diagnosis not present

## 2022-09-03 DIAGNOSIS — M2041 Other hammer toe(s) (acquired), right foot: Secondary | ICD-10-CM

## 2022-09-03 DIAGNOSIS — M7751 Other enthesopathy of right foot: Secondary | ICD-10-CM

## 2022-09-03 NOTE — Progress Notes (Signed)
Subjective:   Patient ID: Rebecca Gray, female   DOB: 22 y.o.   MRN: [id number]   HPI Chief Complaint  Patient presents with   Foot Pain    Dorsal/plantar forefoot (along 4th met) right - aching, sharp pains sometimes x 4 months, no injury, 4th toe shorter than the others   New Patient (Initial Visit)    22 year old female presents the office today with concerns of pain on the fourth toe just behind the skin that she points to.  Over the last several months has been getting pain to the area she gets a stinging sensation to this area at times and gets worse with walking.  Pain is intermittent.  She is tried shoe modifications offloading.  No injury.  She states her fourth toe is short she has multiple family members with the same issue.    No tobacco use Occasional alcohol use.   Review of Systems  All other systems reviewed and are negative.  Past Medical History:  Diagnosis Date   Bacterial infection    in her blood   Medical history non-contributory     No past surgical history on file.  No current outpatient medications on file.  Allergies  Allergen Reactions   Shellfish Allergy           Objective:  Physical Exam  General: AAO x3, NAD  Dermatological: Skin is warm, dry and supple bilateral.  There are no open sores, no preulcerative lesions, no rash or signs of infection present.  Vascular: Dorsalis Pedis artery and Posterior Tibial artery pedal pulses are 2/4 bilateral with immedate capillary fill time. There is no pain with calf compression, swelling, warmth, erythema.   Neruologic: Grossly intact via light touch bilateral.   Musculoskeletal: Fourth toe is short she has tenderness on the fourth MPJ just proximal to this area but there is no area pinpoint tenderness but there is no edema, erythema.  No palpable neuroma.  Flexor, extensor tendons appear to be intact.  MMT 5/5.  Gait: Unassisted, Nonantalgic.       Assessment:   Brachymetatarsia right       Plan:  -Treatment options discussed including all alternatives, risks, and complications -Etiology of symptoms were discussed -X-rays obtained reviewed.  3 views of the foot were obtained.  Short fourth metatarsal noted.  No evidence of acute fracture. -We discussed both conservative as well as surgical treatment options.  We discussed different treatment options and after long discussion she wished to proceed with surgical intervention.  We discussed precautions for pain and not for cosmetic reasons and she understands this. -The incision placement as well as the postoperative course was discussed with the patient. I discussed risks of the surgery which include, but not limited to, infection, bleeding, pain, swelling, need for further surgery, delayed or nonhealing, painful or ugly scar, numbness or sensation changes, over/under correction, recurrence, transfer lesions, further deformity, hardware failure, DVT/PE, loss of toe/foot. Patient understands these risks and wishes to proceed with surgery. The surgical consent was reviewed with the patient all 3 pages were signed. No promises or guarantees were given to the outcome of the procedure. All questions were answered to the best of my ability. Before the surgery the patient was encouraged to call the office if there is any further questions. The surgery will be performed at the [facility] on an outpatient basis.  Trula Slade DPM

## 2022-09-03 NOTE — Patient Instructions (Signed)
Pre-Operative Instructions  Congratulations, you have decided to take an important step to improving your quality of life.  You can be assured that the doctors of [facility] will be with you every step of the way.  Plan to be at the surgery center/hospital at least 1 (one) hour prior to your scheduled time unless otherwise directed by the surgical center/hospital staff.  You must have a responsible adult accompany you, remain during the surgery and drive you home.  Make sure you have directions to the surgical center/hospital and know how to get there on time. For hospital based surgery you will need to obtain a history and physical form from your family physician within 1 month prior to the date of surgery- we will give you a form for you primary physician.  We make every effort to accommodate the date you request for surgery.  There are however, times where surgery dates or times have to be moved.  We will contact you as soon as possible if a change in schedule is required.   No Aspirin/Ibuprofen for one week before surgery.  If you are on aspirin, any non-steroidal anti-inflammatory medications (Mobic, Aleve, Ibuprofen) you should stop taking it 7 days prior to your surgery.  You make take Tylenol  For pain prior to surgery.  Medications- If you are taking daily heart and blood pressure medications, seizure, reflux, allergy, asthma, anxiety, pain or diabetes medications, make sure the surgery center/hospital is aware before the day of surgery so they may notify you which medications to take or avoid the day of surgery. No food or drink after midnight the night before surgery unless directed otherwise by surgical center/hospital staff. No alcoholic beverages 24 hours prior to surgery.  No smoking 24 hours prior to or 24 hours after surgery. Wear loose pants or shorts- loose enough to fit over bandages, boots, and casts. No slip on shoes, sneakers are best. Bring your boot with you to the  surgery center/hospital.  Also bring crutches or a walker if your physician has prescribed it for you.  If you do not have this equipment, it will be provided for you after surgery. If you have not been contracted by the surgery center/hospital by the day before your surgery, call to confirm the date and time of your surgery. Leave-time from work may vary depending on the type of surgery you have.  Appropriate arrangements should be made prior to surgery with your employer. Prescriptions will be provided immediately following surgery by your doctor.  Have these filled as soon as possible after surgery and take the medication as directed. Remove nail polish on the operative foot. Wash the night before surgery.  The night before surgery wash the foot and leg well with the antibacterial soap provided and water paying special attention to beneath the toenails and in between the toes.  Rinse thoroughly with water and dry well with a towel.  Perform this wash unless told not to do so by your physician.  Enclosed: 1 Ice pack (please put in freezer the night before surgery)   1 Hibiclens skin cleaner   Pre-op Instructions  If you have any questions regarding the instructions, do not hesitate to call our office at any point during this process.   [place]: [address] [place], [place] [address] [phone]  [place]: [address] , [place] [address] [phone]  Dr. Calleigh Lafontant, DPM  

## 2022-09-13 ENCOUNTER — Telehealth: Payer: Self-pay | Admitting: Urology

## 2022-09-13 NOTE — Telephone Encounter (Signed)
DOS - 10/13/22  LENGTHENING 1ST MET RIGHT --- [id number] BONE GRAFT RIGHT --- 20900  [employer]  SPOKE WITH LADONNA M. WITH [employer] AND SHE STATED THAT FOR CPT CODES 91478 AND 29562 NO PRIOR AUTH IS REQUIRED.  REF # LADONNA M. 09/13/22 AT 9:09 AM EST

## 2022-10-08 ENCOUNTER — Telehealth: Payer: Self-pay

## 2022-10-08 NOTE — Telephone Encounter (Signed)
Rebecca Gray called to reschedule her surgery with Dr. Ardelle Anton on 10/13/2022. She stated she needs more time for her financial portion due the day of surgery. I have her rescheduled to 12/22/2022. Notified Dr. Ardelle Anton and Aram Beecham at [facility]

## 2022-10-19 ENCOUNTER — Encounter: Payer: BLUE CROSS/BLUE SHIELD | Admitting: Podiatry

## 2022-10-29 ENCOUNTER — Encounter: Payer: BLUE CROSS/BLUE SHIELD | Admitting: Podiatry

## 2022-11-11 ENCOUNTER — Encounter: Payer: BLUE CROSS/BLUE SHIELD | Admitting: Podiatry

## 2022-11-23 ENCOUNTER — Telehealth: Payer: Self-pay | Admitting: Urology

## 2022-11-23 NOTE — Telephone Encounter (Signed)
DOS - 12/22/22  LEGTHENING 1ST MET RIGHT --- [address]109 BONE GRAFT RIGHT --- 20900  [employer] EFFECTIVE DATE - 08/08/22  DEDUCTIBLE - $3,500.00 W/ $3,500.00 REMAINING OOP - $9,100.00 W/ $9,100.00 REMAINING COINSURANCE - 20%   SPOKE WITH JOCELYN WITH [employer] AND SHE STATED THAT FOR CPT CODES 60454 AND 20900 NO PRIOR AUTH IS REQUIRED.   CALL REF # [id number] . 11/23/22 AT 2:20 PM EST

## 2022-12-16 ENCOUNTER — Telehealth: Payer: Self-pay

## 2022-12-16 NOTE — Telephone Encounter (Signed)
Rebecca Gray called to cancel her surgery with Dr. Ardelle Anton on 12/22/2022. She stated she will call back to reschedule.Notified Dr. Ardelle Anton and Aram Beecham at [facility].

## 2022-12-27 ENCOUNTER — Encounter: Payer: BLUE CROSS/BLUE SHIELD | Admitting: Podiatry

## 2023-01-04 ENCOUNTER — Encounter: Payer: BLUE CROSS/BLUE SHIELD | Admitting: Podiatry

## 2023-01-20 ENCOUNTER — Encounter: Payer: BLUE CROSS/BLUE SHIELD | Admitting: Podiatry

## 2023-08-23 ENCOUNTER — Ambulatory Visit: Payer: BLUE CROSS/BLUE SHIELD | Admitting: Podiatry

## 2023-08-29 ENCOUNTER — Ambulatory Visit (INDEPENDENT_AMBULATORY_CARE_PROVIDER_SITE_OTHER): Payer: BC Managed Care – PPO

## 2023-08-29 ENCOUNTER — Encounter: Payer: Self-pay | Admitting: Podiatry

## 2023-08-29 ENCOUNTER — Ambulatory Visit (INDEPENDENT_AMBULATORY_CARE_PROVIDER_SITE_OTHER): Payer: BC Managed Care – PPO | Admitting: Podiatry

## 2023-08-29 DIAGNOSIS — M778 Other enthesopathies, not elsewhere classified: Secondary | ICD-10-CM

## 2023-08-29 DIAGNOSIS — Q72899 Other reduction defects of unspecified lower limb: Secondary | ICD-10-CM | POA: Diagnosis not present

## 2023-08-29 NOTE — Progress Notes (Unsigned)
 Subjective:   Patient ID: Rebecca Gray, female   DOB: 23 y.o.   MRN: [id number]   HPI Chief Complaint  Patient presents with   Flat Foot    RM#11 Surgical Consult right foot.      23 year old female presents the office today with concerns of pain on the fourth toe just behind the skin that she points to.  Describes a cramping sensation going up her foot to the lateral aspect.  She has tried shoe modification some offloading continues to have symptoms and should proceed with surgery.  No tobacco use Occasional alcohol use.       Objective:  Physical Exam  General: AAO x3, NAD  Dermatological: Skin is warm, dry and supple bilateral.  There are no open sores, no preulcerative lesions, no rash or signs of infection present.  Vascular: Dorsalis Pedis artery and Posterior Tibial artery pedal pulses are 2/4 bilateral with immedate capillary fill time. There is no pain with calf compression, swelling, warmth, erythema.   Neruologic: Grossly intact via light touch bilateral.   Musculoskeletal: Fourth toe is short she has tenderness on the fourth MPJ just proximal to this area but there is no area pinpoint tenderness but there is no edema, erythema.  No palpable neuroma.  Flexor, extensor tendons appear to be intact.  MMT 5/5.  Crepitus evident.  Gait: Unassisted, Nonantalgic.       Assessment:   Brachymetatarsia right      Plan:  -Treatment options discussed including all alternatives, risks, and complications -Etiology of symptoms were discussed -X-rays obtained reviewed.  3 views of the foot were obtained.  Short fourth metatarsal noted.  No evidence of acute fracture. -We again discussed both conservative as well as surgical treatment options.  I discussed with her I am not doing the surgery for cosmetic reasons.  Also discussed this is not again she is can have resolved rest for symptoms were discussed shoes, good arch support.  However given the pain still left fourth toe  area show to proceed with surgery.  I discussed with her lengthening fourth metatarsal bone graft, plate, screw fixation -The incision placement as well as the postoperative course was discussed with the patient. I discussed risks of the surgery which include, but not limited to, infection, bleeding, pain, swelling, need for further surgery, delayed or nonhealing, painful or ugly scar, numbness or sensation changes, over/under correction, recurrence, transfer lesions, further deformity, hardware failure, DVT/PE, loss of toe/foot. Patient understands these risks and wishes to proceed with surgery. The surgical consent was reviewed with the patient all 3 pages were signed. No promises or guarantees were given to the outcome of the procedure. All questions were answered to the best of my ability. Before the surgery the patient was encouraged to call the office if there is any further questions. The surgery will be performed at the [facility] on an outpatient basis.  Vivi Barrack DPM

## 2023-08-29 NOTE — Patient Instructions (Signed)

## 2023-09-05 ENCOUNTER — Telehealth: Payer: Self-pay | Admitting: Podiatry

## 2023-09-05 NOTE — Telephone Encounter (Signed)
 Pt called to confirm that she can do the sx date of 4/16 with Dr.Wagoner. Said she was told to call back if 4/16 would work for her.

## 2023-10-10 ENCOUNTER — Telehealth: Payer: Self-pay | Admitting: Podiatry

## 2023-10-10 NOTE — Telephone Encounter (Signed)
 DOS: 10/19/23  OSTEOTOMY 4TH RT 16109 BONE GRAFT RT [address]    EFFECTIVE DATE:  05/06/2023   DEDUCTIBLE:  $1,650.00  REMAINING:  $1,252.72  OOP: $3,050.00  REMAINING:  20%  CO INSURANCE :  PER LYN M OF [employer] NO PRIOR AUTH IS REQ FOR CPT CODES [id number]   REF# [id number]

## 2023-10-24 ENCOUNTER — Encounter: Admitting: Podiatry

## 2023-11-02 ENCOUNTER — Other Ambulatory Visit: Payer: Self-pay | Admitting: Podiatry

## 2023-11-02 DIAGNOSIS — Q72811 Congenital shortening of right lower limb: Secondary | ICD-10-CM

## 2023-11-02 MED ORDER — OXYCODONE-ACETAMINOPHEN 5-325 MG PO TABS: 1.0000 | ORAL_TABLET | Freq: Four times a day (QID) | ORAL | 0 refills | Status: DC | PRN

## 2023-11-02 MED ORDER — IBUPROFEN 800 MG PO TABS: 800.0000 mg | ORAL_TABLET | Freq: Three times a day (TID) | ORAL | 0 refills | Status: DC | PRN

## 2023-11-02 MED ORDER — PROMETHAZINE HCL 25 MG PO TABS: 25.0000 mg | ORAL_TABLET | Freq: Three times a day (TID) | ORAL | 0 refills | Status: AC | PRN

## 2023-11-02 MED ORDER — CEPHALEXIN 500 MG PO CAPS: 500.0000 mg | ORAL_CAPSULE | Freq: Three times a day (TID) | ORAL | 0 refills | Status: AC

## 2023-11-03 ENCOUNTER — Encounter: Admitting: Podiatry

## 2023-11-05 ENCOUNTER — Other Ambulatory Visit: Payer: Self-pay

## 2023-11-05 ENCOUNTER — Emergency Department (HOSPITAL_BASED_OUTPATIENT_CLINIC_OR_DEPARTMENT_OTHER)
Admission: EM | Admit: 2023-11-05 | Discharge: 2023-11-05 | Disposition: A | Discharge status: Home / Self Care | Attending: Emergency Medicine | Admitting: Emergency Medicine

## 2023-11-05 ENCOUNTER — Encounter (HOSPITAL_BASED_OUTPATIENT_CLINIC_OR_DEPARTMENT_OTHER): Payer: Self-pay | Admitting: Emergency Medicine

## 2023-11-05 DIAGNOSIS — M79671 Pain in right foot: Secondary | ICD-10-CM | POA: Diagnosis not present

## 2023-11-05 DIAGNOSIS — G8918 Other acute postprocedural pain: Secondary | ICD-10-CM | POA: Diagnosis present

## 2023-11-05 LAB — CBC WITH DIFFERENTIAL/PLATELET
Abs Immature Granulocytes: 0.02 10*3/uL (ref 0.00–0.07)
Basophils Absolute: 0 10*3/uL (ref 0.0–0.1)
Basophils Relative: 1 %
Eosinophils Absolute: 0.4 10*3/uL (ref 0.0–0.5)
Eosinophils Relative: 8 %
HCT: 38.9 % (ref 36.0–46.0)
Hemoglobin: 12.5 g/dL (ref 12.0–15.0)
Immature Granulocytes: 0 %
Lymphocytes Relative: 34 %
Lymphs Abs: 1.8 10*3/uL (ref 0.7–4.0)
MCH: 27.4 pg (ref 26.0–34.0)
MCHC: 32.1 g/dL (ref 30.0–36.0)
MCV: 85.1 fL (ref 80.0–100.0)
Monocytes Absolute: 0.5 10*3/uL (ref 0.1–1.0)
Monocytes Relative: 9 %
Neutro Abs: 2.6 10*3/uL (ref 1.7–7.7)
Neutrophils Relative %: 48 %
Platelets: 277 10*3/uL (ref 150–400)
RBC: 4.57 MIL/uL (ref 3.87–5.11)
RDW: 12.2 % (ref 11.5–15.5)
WBC: 5.4 10*3/uL (ref 4.0–10.5)
nRBC: 0 % (ref 0.0–0.2)

## 2023-11-05 LAB — COMPREHENSIVE METABOLIC PANEL WITH GFR
ALT: 7 U/L (ref 0–44)
AST: 24 U/L (ref 15–41)
Albumin: 4.2 g/dL (ref 3.5–5.0)
Alkaline Phosphatase: 88 U/L (ref 38–126)
Anion gap: 11 (ref 5–15)
BUN: <5 mg/dL — ABNORMAL LOW (ref 6–20)
CO2: 24 mmol/L (ref 22–32)
Calcium: 9.4 mg/dL (ref 8.9–10.3)
Chloride: 103 mmol/L (ref 98–111)
Creatinine, Ser: 0.63 mg/dL (ref 0.44–1.00)
GFR, Estimated: >60 mL/min (ref >60)
Glucose, Bld: 98 mg/dL (ref 70–99)
Potassium: 4 mmol/L (ref 3.5–5.1)
Sodium: 137 mmol/L (ref 135–145)
Total Bilirubin: 0.2 mg/dL (ref 0.0–1.2)
Total Protein: 7.1 g/dL (ref 6.5–8.1)

## 2023-11-05 NOTE — ED Notes (Signed)
 Pt given discharge instructions. Opportunities given for questions. Pt verbalizes understanding. Jillyn Hidden, RN

## 2023-11-05 NOTE — ED Triage Notes (Signed)
 Patient reports increased pain in right foot after surgery performed on 4/30. Denies fevers, chills, body aches.

## 2023-11-05 NOTE — ED Provider Notes (Signed)
 [place] EMERGENCY DEPARTMENT AT [facility] Provider Note   CSN: [id number] Arrival date & time: 11/05/23  4098     History  Chief Complaint  Patient presents with   Post-op Problem    Rebecca Gray is a 23 y.o. female.  Patient here postop pain to her right foot.  She had surgery with podiatry last week to her right foot where she has pain to her right toe.  Sounds like she is on antibiotics and pain meds.  She is feeling a lot of pressure in her foot but denies any fever or chills.  Denies any trauma.  She has been in a walking boot.  Pain has been hard to deal with.  She denies any excessive drainage no bleeding through the boot.  The history is provided by the patient.       Home Medications Prior to Admission medications   Medication Sig Start Date End Date Taking? Authorizing Provider  cephALEXin  (KEFLEX ) 500 MG capsule Take 1 capsule (500 mg total) by mouth 3 (three) times daily. 11/02/23   Charity Conch, DPM  ibuprofen  (ADVIL ) 800 MG tablet Take 1 tablet (800 mg total) by mouth every 8 (eight) hours as needed. 11/02/23   Charity Conch, DPM  oxyCODONE -acetaminophen  (PERCOCET/ROXICET) 5-325 MG tablet Take 1-2 tablets by mouth every 6 (six) hours as needed for severe pain (pain score 7-10). 11/02/23   Charity Conch, DPM  promethazine  (PHENERGAN ) 25 MG tablet Take 1 tablet (25 mg total) by mouth every 8 (eight) hours as needed for nausea or vomiting. 11/02/23   Charity Conch, DPM      Allergies    Shellfish allergy    Review of Systems   Review of Systems  Physical Exam Updated Vital Signs BP (!) 137/105   Pulse 99   Temp 98.2 F (36.8 C) (Oral)   Resp 16   Ht 5\' 4"  (1.626 m)   Wt 63.5 kg   SpO2 96%   BMI 24.03 kg/m  Physical Exam Vitals and nursing note reviewed.  Constitutional:      General: She is not in acute distress.    Appearance: She is well-developed. She is not ill-appearing.  HENT:     Head: Normocephalic and  atraumatic.     Nose: Nose normal.     Mouth/Throat:     Mouth: Mucous membranes are moist.  Eyes:     Extraocular Movements: Extraocular movements intact.     Conjunctiva/sclera: Conjunctivae normal.     Pupils: Pupils are equal, round, and reactive to light.  Cardiovascular:     Rate and Rhythm: Normal rate and regular rhythm.     Pulses: Normal pulses.     Heart sounds: No murmur heard. Pulmonary:     Effort: Pulmonary effort is normal. No respiratory distress.     Breath sounds: Normal breath sounds.  Abdominal:     General: Abdomen is flat.     Palpations: Abdomen is soft.     Tenderness: There is no abdominal tenderness.  Musculoskeletal:        General: No swelling or tenderness.     Cervical back: Normal range of motion and neck supple.     Comments: She has no tenderness to her calf maybe a little tender at her surgical site  Skin:    General: Skin is warm and dry.     Capillary Refill: Capillary refill takes less than 2 seconds.     Findings: No bruising  or erythema.     Comments: Both surgical sites on the right foot are clean dry and intact there is no major swelling purulent drainage or erythema  Neurological:     General: No focal deficit present.     Mental Status: She is alert and oriented to person, place, and time.     Cranial Nerves: No cranial nerve deficit.     Sensory: No sensory deficit.     Motor: No weakness.     Coordination: Coordination normal.  Psychiatric:        Mood and Affect: Mood normal.     ED Results / Procedures / Treatments   Labs (all labs ordered are listed, but only abnormal results are displayed) Labs Reviewed  COMPREHENSIVE METABOLIC PANEL WITH GFR - Abnormal; Notable for the following components:      Result Value   BUN <5 (*)    All other components within normal limits  CBC WITH DIFFERENTIAL/PLATELET    EKG None  Radiology No results found.  Procedures Procedures    Medications Ordered in ED Medications - No  data to display  ED Course/ Medical Decision Making/ A&P                                 Medical Decision Making Amount and/or Complexity of Data Reviewed Labs: ordered.   Rebecca Gray is here with postop pain.  She had procedure done to her right fourth toe couple days ago with podiatry.  She is in a walking boot.  She had a pain or some sort of extender Peace put into her right toe to help with she was dealing with sounds like she is on antibiotics narcotic pain medicine.  Pain has been increasing his last 4 days not sure if its normal or not for her.  Overall pain that she is having discomfort at her surgical site I did have to remove her walking boot and her surgical dressing to evaluate.  She is neurovascular neuromuscularly intact on exam.  She had good pulses.  Surgical sites are clean dry and intact.  There did not appear to be any excessive swelling redness or any signs to suggest infectious process.  She does not have any calf tenderness or lower leg swelling that would make me think that there is a DVT.  She felt much better when I took the boot off and on not sure she was just having increased pain due to the compression that was going on although I suspect that this is something that her surgeon wants.  Lab work was unremarkable for my review and interpretation.  She had no fever no white count.  Ultimately I dressed the surgical site back to the best my abilities with and put her walking boot back on make sure that it was fitting well snug and strapped down correctly.  Ultimately I think this is expected postop pain.  Unfortunately she needs to remain in this walking boot and surgical dressing.  She has follow-up with podiatry in 2 days.  Told to return if symptoms worsen.  Discharged in good condition.  I did message Dr. Clydia Dart that I did take down the surgical dressing.  My hope is that this was okay but ultimately could not really do evaluation today without doing so.  Patient  discharged in good condition.  This chart was dictated using voice recognition software.  Despite best efforts to proofread,  errors can occur which can change the documentation meaning.         Final Clinical Impression(s) / ED Diagnoses Final diagnoses:  Post-op pain  Right foot pain    Rx / DC Orders ED Discharge Orders     None         Lowery Rue, DO 11/05/23 0725

## 2023-11-05 NOTE — ED Notes (Signed)
 Dressing changed at bedside by MD. Pt tolerated well. Incision CDI.

## 2023-11-05 NOTE — Discharge Instructions (Signed)
 Continue your current instructions of your podiatrist.  I recommend leaving the walking boot on until you see them Monday.  Hopefully things feel little bit more comfortable with readjustment and with some reassurance on exam.  Continue your medications as provided by your podiatrist.  If you develop fever worsening pain or discomfort or any other issues please return for evaluation.  Follow-up with podiatry.

## 2023-11-07 ENCOUNTER — Encounter: Payer: Self-pay | Admitting: Podiatry

## 2023-11-07 ENCOUNTER — Ambulatory Visit (INDEPENDENT_AMBULATORY_CARE_PROVIDER_SITE_OTHER): Admitting: Podiatry

## 2023-11-07 ENCOUNTER — Ambulatory Visit (INDEPENDENT_AMBULATORY_CARE_PROVIDER_SITE_OTHER)

## 2023-11-07 DIAGNOSIS — Q72891 Other reduction defects of right lower limb: Secondary | ICD-10-CM | POA: Diagnosis not present

## 2023-11-07 DIAGNOSIS — Q72899 Other reduction defects of unspecified lower limb: Secondary | ICD-10-CM

## 2023-11-07 MED ORDER — OXYCODONE-ACETAMINOPHEN 5-325 MG PO TABS: 1.0000 | ORAL_TABLET | Freq: Four times a day (QID) | ORAL | 0 refills | Status: AC | PRN

## 2023-11-07 NOTE — Progress Notes (Signed)
 Subjective: Chief Complaint  Patient presents with   Routine Post Op    RM#11 POV # 1 DOS 11/02/23 --- RIGHT FOOT 4TH METATARSAL LENTHENING DUE TO BRACHYMETARSAGIA WITH BONE GRAFT FROM CADAERAND PLATE AND SCREW FIXATION-Patient states was having a lot of pain.   23 year old female presents the office today for the above concerns.  She did go to the emergency room over the weekend for pain but she states that she is feeling much better now.  She does not report any fevers or chills.  Objective: AAO x3, NAD DP/PT pulses palpable bilaterally, CRT less than 3 seconds Incisions healing well.  Sutures are intact and the incision is well coapted.  There is localized edema over there is no erythema, drainage or pus or any signs of infection.  There is immediate capillary fill time to the toe.  Toe is rectus. No pain with calf compression, swelling, warmth, erythema  Assessment: Status post brachymetatarsia repair right fourth  Plan: -All treatment options discussed with the patient including all alternatives, risks, complications.  -X-rays obtained reviewed.  3 views were obtained.  Hardware intact with graft.  I discussed with him on the oblique view it looks like one of the screws is not completely in the bone.  However during surgery on multiple fluoroscopy shots it appeared to be purchasing well. -Incision clean.  Antibiotic ointment applied followed by dressing.  She keep the dressing clean, dry, intact. -Continue wearing the boot at all times, nonweightbearing -Ice/elevation -Patient encouraged to call the office with any questions, concerns, change in symptoms.   Charity Conch DPM

## 2023-11-09 DIAGNOSIS — Z0271 Encounter for disability determination: Secondary | ICD-10-CM

## 2023-11-09 NOTE — Telephone Encounter (Signed)
 Faxed STD/notes to [facility] [phone] RTW date 01/02/24

## 2023-11-17 ENCOUNTER — Ambulatory Visit (INDEPENDENT_AMBULATORY_CARE_PROVIDER_SITE_OTHER)

## 2023-11-17 ENCOUNTER — Ambulatory Visit (INDEPENDENT_AMBULATORY_CARE_PROVIDER_SITE_OTHER): Admitting: Podiatry

## 2023-11-17 ENCOUNTER — Encounter: Admitting: Podiatry

## 2023-11-17 DIAGNOSIS — Q72891 Other reduction defects of right lower limb: Secondary | ICD-10-CM

## 2023-11-17 DIAGNOSIS — Q72899 Other reduction defects of unspecified lower limb: Secondary | ICD-10-CM

## 2023-11-17 NOTE — Progress Notes (Signed)
 She presents today postop visit #2 date of surgery 11/02/2023 she is status post fourth metatarsal lengthening procedure due to breaking metatarsal with bone graft and plate.  This is Dr. Benito Bran patient she denies fever chills nausea vomiting muscle aches and pains.  Presents with her cam boot nonweightbearing status.  Objective: Wants to dry sterile compressive dressing was removed demonstrates moderate edema no erythema cellulitis drainage or odor K wires in place.  Sutures are in place margins well coapted sutures were removed today.  No signs of infection.  Radiographs taken demonstrate near perfect length of the fourth metatarsal and early yet for incorporation of the bone graft.  K wires in good position and internal fixation appears to be in the same position as it was initially.  Assessment: Well-healing surgical foot.  Plan: Remove sutures today dressed a compressive dressing was applied continue nonweightbearing status continue to keep this dry and clean she will follow-up with Dr. Mabel Savage in 2 weeks for another set of x-rays.

## 2023-11-23 ENCOUNTER — Ambulatory Visit (INDEPENDENT_AMBULATORY_CARE_PROVIDER_SITE_OTHER): Admitting: Podiatrist

## 2023-11-23 ENCOUNTER — Encounter: Payer: Self-pay | Admitting: Podiatrist

## 2023-11-23 ENCOUNTER — Ambulatory Visit (INDEPENDENT_AMBULATORY_CARE_PROVIDER_SITE_OTHER)

## 2023-11-23 DIAGNOSIS — M79671 Pain in right foot: Secondary | ICD-10-CM

## 2023-11-23 DIAGNOSIS — Q72899 Other reduction defects of unspecified lower limb: Secondary | ICD-10-CM

## 2023-11-23 DIAGNOSIS — Z9889 Other specified postprocedural states: Secondary | ICD-10-CM

## 2023-11-23 NOTE — Progress Notes (Signed)
 Chief Complaint  Patient presents with   Post-op Problem    "I fell on it yesterday.  I just want to make sure I didn't break the wire and that everything is okay."     DOS 11/02/23 --- RIGHT FOOT 4TH METATARSAL LENTHENING DUE TO BRACHYMETARSAGIA WITH BONE GRAFT FROM CADAERAND PLATE AND SCREW FIXATION-   HPI: Patient is 23 y.o. female who presents today after she fell off her rolling scooter yesterday. She had her boot on and dressing intact. She wants to make sure she didn't break her wire or mess up her plate.    Allergies  Allergen Reactions   Shellfish Allergy     Review of systems is negative except as noted in the HPI.  Denies nausea/ vomiting/ fevers/ chills or night sweats.   Denies difficulty breathing, denies calf pain or tenderness  Physical Exam  Patient is awake, alert, and oriented x 3.  In no acute distress.    Neurovascular status intact. Excellent appearance of post op foot is noted.  Incision is healing well.  Pin fixation in place- does not appear to have moved.  Xrays confirm pin placement in good position and plate and graft are in good position.  No changes from last xray.   Assessment:   ICD-10-CM   1. Brachymetatarsia of fourth metatarsal bone  Q72.899 DG Foot Complete Right    2. Status post right foot surgery  Z98.890        Plan: Removed the bandage and foot looks great-  xrays show no change and no damage from her fall noted.   The foot was re dressed with a dry sterile fluffy dressing.  Boot applied.  She is instructed to continue non weight bearing and use of boot.  She will return for her regularly schedule post op appt with Dr. Clydia Dart.

## 2023-12-01 ENCOUNTER — Ambulatory Visit (INDEPENDENT_AMBULATORY_CARE_PROVIDER_SITE_OTHER): Admitting: Podiatry

## 2023-12-01 ENCOUNTER — Ambulatory Visit (INDEPENDENT_AMBULATORY_CARE_PROVIDER_SITE_OTHER)

## 2023-12-01 DIAGNOSIS — Q72891 Other reduction defects of right lower limb: Secondary | ICD-10-CM

## 2023-12-01 MED ORDER — GABAPENTIN 100 MG PO CAPS: 100.0000 mg | ORAL_CAPSULE | Freq: Three times a day (TID) | ORAL | 1 refills | Status: AC

## 2023-12-01 NOTE — Progress Notes (Signed)
 Subjective: Chief Complaint  Patient presents with   Routine Post Op    RM#11 POV # 3 DOS 11/02/23 --- RIGHT FOOT 4TH METATARSAL LENTHENING DUE TO BRACHYMETARSAGIA WITH BONE GRAFT FROM CADAERAND PLATE AND SCREW FIXATION. Pt states a burning sensation in the fourth right toe.    23 year old female presents the office today for the above concerns.  She was seen last week with Dr. Maybelle Spatz due to a fall. Sutures removed last appointment.  She is not taking pain medication but she is describing burning mostly to the fourth toe.  She does not report any fevers or chills.  Objective: AAO x3, NAD DP/PT pulses palpable bilaterally, CRT less than 3 seconds Incisions healing well.  Incision well coapted without any evidence of dehiscence.  There is no surrounding erythema, ascending cellulitis and there is no drainage or pus.  Toe is rectus.  Mild discomfort on exam.  No other areas of discomfort noted. No pain with calf compression, swelling, warmth, erythema  Assessment: Status post brachymetatarsia repair right fourth  Plan: -All treatment options discussed with the patient including all alternatives, risks, complications.  -X-rays obtained reviewed.  3 views were obtained.  Hardware intact with graft.  Appears to be in the same position as prior.  There is no evidence of acute fracture. - Incisions healing well.  Xeroform applied followed by dressing. Discussed she can change the dressing with if needed otherwise she can leave it intact.  We discussed how to do the dressing changes. - Prescribe gabapentin given the burning.  Discussed side effects.  She will start with taking it at nighttime and during the day if no side effects. - Continue nonweightbearing.  Ice, elevation. -Monitor for any clinical signs or symptoms of infection and directed to call the office immediately should any occur or go to the ER.  Return in about 2 weeks (around 12/15/2023) for post-op, x-ray and pin removal .  Charity Conch DPM

## 2023-12-01 NOTE — Patient Instructions (Signed)
 Gabapentin Capsules or Tablets What is this medication? GABAPENTIN (GA ba pen tin) treats nerve pain. It may also be used to prevent and control seizures in people with epilepsy. It works by calming overactive nerves in your body. This medicine may be used for other purposes; ask your health care provider or pharmacist if you have questions. COMMON BRAND NAME(S): Active-PAC with Gabapentin, Ascencion Dike, Gralise, Neurontin What should I tell my care team before I take this medication? They need to know if you have any of these conditions: Kidney disease Lung or breathing disease Substance use disorder Suicidal thoughts, plans, or attempt by you or a family member An unusual or allergic reaction to gabapentin, other medications, foods, dyes, or preservatives Pregnant or trying to get pregnant Breastfeeding How should I use this medication? Take this medication by mouth with a glass of water. Follow the directions on the prescription label. You can take it with or without food. If it upsets your stomach, take it with food. Take your medication at regular intervals. Do not take it more often than directed. Do not stop taking except on your care team's advice. If you are directed to break the 600 or 800 mg tablets in half as part of your dose, the extra half tablet should be used for the next dose. If you have not used the extra half tablet within 28 days, it should be thrown away. A special MedGuide will be given to you by the pharmacist with each prescription and refill. Be sure to read this information carefully each time. Talk to your care team about the use of this medication in children. While this medication may be prescribed for children as young as 3 years for selected conditions, precautions do apply. Overdosage: If you think you have taken too much of this medicine contact a poison control center or emergency room at once. NOTE: This medicine is only for you. Do not share this medicine with  others. What if I miss a dose? If you miss a dose, take it as soon as you can. If it is almost time for your next dose, take only that dose. Do not take double or extra doses. What may interact with this medication? Alcohol Antihistamines for allergy, cough, and cold Certain medications for anxiety or sleep Certain medications for depression like amitriptyline, fluoxetine, sertraline Certain medications for seizures like phenobarbital, primidone Certain medications for stomach problems General anesthetics like halothane, isoflurane, methoxyflurane, propofol Local anesthetics like lidocaine, pramoxine, tetracaine Medications that relax muscles for surgery Opioid medications for pain Phenothiazines like chlorpromazine, mesoridazine, prochlorperazine, thioridazine This list may not describe all possible interactions. Give your health care provider a list of all the medicines, herbs, non-prescription drugs, or dietary supplements you use. Also tell them if you smoke, drink alcohol, or use illegal drugs. Some items may interact with your medicine. What should I watch for while using this medication? Visit your care team for regular checks on your progress. You may want to keep a record at home of how you feel your condition is responding to treatment. You may want to share this information with your care team at each visit. You should contact your care team if your seizures get worse or if you have any new types of seizures. Do not stop taking this medication or any of your seizure medications unless instructed by your care team. Stopping your medication suddenly can increase your seizures or their severity. This medication may cause serious skin reactions. They can happen weeks to  months after starting the medication. Contact your care team right away if you notice fevers or flu-like symptoms with a rash. The rash may be red or purple and then turn into blisters or peeling of the skin. Or, you might  notice a red rash with swelling of the face, lips or lymph nodes in your neck or under your arms. Wear a medical identification bracelet or chain if you are taking this medication for seizures. Carry a card that lists all your medications. This medication may affect your coordination, reaction time, or judgment. Do not drive or operate machinery until you know how this medication affects you. Sit up or stand slowly to reduce the risk of dizzy or fainting spells. Drinking alcohol with this medication can increase the risk of these side effects. Your mouth may get dry. Chewing sugarless gum or sucking hard candy, and drinking plenty of water may help. Watch for new or worsening thoughts of suicide or depression. This includes sudden changes in mood, behaviors, or thoughts. These changes can happen at any time but are more common in the beginning of treatment or after a change in dose. Call your care team right away if you experience these thoughts or worsening depression. If you become pregnant while using this medication, you may enroll in the [place] American Antiepileptic Drug Pregnancy Registry by calling [phone]. This registry collects information about the safety of antiepileptic medication use during pregnancy. What side effects may I notice from receiving this medication? Side effects that you should report to your care team as soon as possible: Allergic reactions or angioedema--skin rash, itching, hives, swelling of the face, eyes, lips, tongue, arms, or legs, trouble swallowing or breathing Rash, fever, and swollen lymph nodes Thoughts of suicide or self harm, worsening mood, feelings of depression Trouble breathing Unusual changes in mood or behavior in children after use such as difficulty concentrating, hostility, or restlessness Side effects that usually do not require medical attention (report to your care team if they continue or are  bothersome): Dizziness Drowsiness Nausea Swelling of ankles, feet, or hands Vomiting This list may not describe all possible side effects. Call your doctor for medical advice about side effects. You may report side effects to FDA at 1-800-FDA-1088. Where should I keep my medication? Keep out of reach of children and pets. Store at room temperature between 15 and 30 degrees C (59 and 86 degrees F). Get rid of any unused medication after the expiration date. This medication may cause accidental overdose and death if taken by other adults, children, or pets. To get rid of medications that are no longer needed or have expired: Take the medication to a medication take-back program. Check with your pharmacy or law enforcement to find a location. If you cannot return the medication, check the label or package insert to see if the medication should be thrown out in the garbage or flushed down the toilet. If you are not sure, ask your care team. If it is safe to put it in the trash, empty the medication out of the container. Mix the medication with cat litter, dirt, coffee grounds, or other unwanted substance. Seal the mixture in a bag or container. Put it in the trash. NOTE: This sheet is a summary. It may not cover all possible information. If you have questions about this medicine, talk to your doctor, pharmacist, or health care provider.  2024 Elsevier/Gold Standard (2022-04-06 00:00:00)

## 2023-12-15 ENCOUNTER — Ambulatory Visit (INDEPENDENT_AMBULATORY_CARE_PROVIDER_SITE_OTHER)

## 2023-12-15 ENCOUNTER — Ambulatory Visit (INDEPENDENT_AMBULATORY_CARE_PROVIDER_SITE_OTHER): Admitting: Podiatry

## 2023-12-15 DIAGNOSIS — Q72899 Other reduction defects of unspecified lower limb: Secondary | ICD-10-CM

## 2023-12-15 DIAGNOSIS — Q7231 Congenital absence of right foot and toe(s): Secondary | ICD-10-CM

## 2023-12-15 NOTE — Progress Notes (Signed)
 Subjective: Chief Complaint  Patient presents with   Post-op Follow-up    RM 13RM 13 2wk x-ray and pin removal POV # 4 DOS 11/02/23 --- RIGHT FOOT 4TH METATARSAL LENTHENING DUE TO BRACHYMETARSAGIA WITH BONE GRAFT FROM CADAERAND PLATE AND SCREW FIXATION.Patient states no pain or discharge at site. Site healing very well, redness or major  swelling.    23 year old female presents the office today for the above concerns.  She presents today for K wire removal.  States that she has been doing well she is not having any significant pain where she is requiring pain medication.  She still been nonweightbearing in the CAM boot. She does not report any new issues. No fevers/chills or other concerns today.   Objective: AAO x3, NAD DP/PT pulses palpable bilaterally, CRT less than 3 seconds Incisions healing well.  Incision well coapted without any evidence of dehiscence and a scar is forming.  K wire intact of the fourth toe without any drainage or pus or signs of infection.  There is mild edema present on the surgical sites but no erythema or warmth. No signs of infection.  No pain with calf compression, swelling, warmth, erythema  Assessment: Status post brachymetatarsia repair right fourth  Plan: -All treatment options discussed with the patient including all alternatives, risks, complications.  -X-rays obtained reviewed.  3 views were obtained.  Hardware intact with graft.  Appears to be in the same position as prior.  There is no evidence of acute fracture. Some consolidation across the graft site. Again discussed screw placement along the proximal medial aspect. -K-wire removed today without complications.  Talus sitting rectus. -Dressing.  She can start to shower tomorrow but do not soak the foot.  Reapply the bandage.  Continue ice, elevation as well as compression. -Discussed that she can start to gradually transition to partial weightbearing although limited.  Should symptoms worsen return to  nonweightbearing.  Return in about 2 weeks (around 12/29/2023) for post-op, x-ray.  Charity Conch DPM

## 2023-12-19 ENCOUNTER — Telehealth: Payer: Self-pay | Admitting: Podiatry

## 2023-12-19 ENCOUNTER — Encounter: Payer: Self-pay | Admitting: Podiatry

## 2023-12-19 NOTE — Telephone Encounter (Signed)
 Noted and thank you

## 2023-12-19 NOTE — Telephone Encounter (Signed)
 pt had left mess on my vmail. I called back and advised her forms sent to [facility] 11/09/23 have RTW date 01/02/24. She said that was fine to keep.

## 2023-12-19 NOTE — Telephone Encounter (Signed)
 Some skin came off with removal of bandage. Patient originally thought this was ointment but it was skin. Denies pain. There is a very small amount of bleeding. Happened on Saturday. She is going to send a Building services engineer via mychart.

## 2023-12-19 NOTE — Telephone Encounter (Signed)
 Patient stated part of skin came off of fourth toe on right foot. Patient would like to speak with nurse or provider. Patient contact telephone number, [phone]

## 2023-12-20 DIAGNOSIS — Z0271 Encounter for disability determination: Secondary | ICD-10-CM

## 2023-12-20 NOTE — Telephone Encounter (Signed)
 Recd forms from [employer]. Faxed notes and forms [phone]

## 2023-12-26 NOTE — Telephone Encounter (Signed)
 pf lft mess on my vmail. I called back. She said [facility] did not get RTW date form??? I adv faxed on 12/20/23 and confirmation was recd. I resent and emailed her copy

## 2023-12-27 ENCOUNTER — Ambulatory Visit
Admission: RE | Admit: 2023-12-27 | Discharge: 2023-12-27 | Disposition: A | Discharge status: Home / Self Care | Source: Ambulatory Visit | Attending: Family Medicine | Admitting: Family Medicine

## 2023-12-27 VITALS — BP 116/76 | HR 81 | Temp 98.5°F | Resp 17

## 2023-12-27 DIAGNOSIS — L089 Local infection of the skin and subcutaneous tissue, unspecified: Secondary | ICD-10-CM | POA: Diagnosis not present

## 2023-12-27 DIAGNOSIS — S31135A Puncture wound of abdominal wall without foreign body, periumbilic region without penetration into peritoneal cavity, initial encounter: Secondary | ICD-10-CM

## 2023-12-27 NOTE — ED Triage Notes (Signed)
 Pt present with navel swelling and hardness. Pt states she is concerned of a possible infection. She currently has a belly piercing.

## 2023-12-27 NOTE — ED Provider Notes (Signed)
 [facility]    CSN: [id number] Arrival date & time: 12/27/23  1320      History   Chief Complaint Chief Complaint  Patient presents with   Wound Check    Possible infection where belly button is pierced - Entered by patient    HPI Rebecca Gray is a 23 y.o. female presents for bellybutton infection.  Patient reports she had her navel pierced in January.  About a month ago she noticed some swelling primarily to the right side of the navel.  States she contacted the piercing placed and they told her that was normal to continue cleaning it.  States it went down slightly but then a week or so ago began to worsen and today became painful.  She does endorse some purulent drainage from the piercing site.  No fevers or chills.  No history of MRSA.  She states that she went to her doctor today for the same complaint and was placed on Bactrim  but she wanted a second opinion regarding removing the piercing.  No other concerns at this time.   Wound Check    Past Medical History:  Diagnosis Date   Bacterial infection    in her blood   Medical history non-contributory     Patient Active Problem List   Diagnosis Date Noted   Cough 05/03/2022    History reviewed. No pertinent surgical history.  OB History   No obstetric history on file.      Home Medications    Prior to Admission medications   Medication Sig Start Date End Date Taking? Authorizing Provider  cephALEXin  (KEFLEX ) 500 MG capsule Take 1 capsule (500 mg total) by mouth 3 (three) times daily. Patient not taking: Reported on 12/15/2023 11/02/23   Gershon Donnice SAUNDERS, DPM  gabapentin  (NEURONTIN ) 100 MG capsule Take 1 capsule (100 mg total) by mouth 3 (three) times daily. Patient not taking: Reported on 12/15/2023 12/01/23   Gershon Donnice SAUNDERS, DPM  ibuprofen  (ADVIL ) 800 MG tablet Take 1 tablet (800 mg total) by mouth every 8 (eight) hours as needed. Patient not taking: Reported on 12/15/2023 11/02/23   Gershon Donnice SAUNDERS, DPM  oxyCODONE -acetaminophen  (PERCOCET/ROXICET) 5-325 MG tablet Take 1-2 tablets by mouth every 6 (six) hours as needed for severe pain (pain score 7-10). Patient not taking: Reported on 12/15/2023 11/07/23   Gershon Donnice SAUNDERS, DPM  promethazine  (PHENERGAN ) 25 MG tablet Take 1 tablet (25 mg total) by mouth every 8 (eight) hours as needed for nausea or vomiting. Patient not taking: Reported on 12/15/2023 11/02/23   Gershon Donnice SAUNDERS, DPM    Family History History reviewed. No pertinent family history.  Social History Social History   Tobacco Use   Smoking status: Never    Passive exposure: Yes   Smokeless tobacco: Never  Vaping Use   Vaping status: Never Used  Substance Use Topics   Alcohol use: Not Currently    Comment: occasional   Drug use: No     Allergies   Shellfish allergy   Review of Systems Review of Systems  Skin:        Infected bellybutton piercing      Physical Exam Triage Vital Signs ED Triage Vitals  Encounter Vitals Group     BP 12/27/23 1354 116/76     Girls Systolic BP Percentile --      Girls Diastolic BP Percentile --      Boys Systolic BP Percentile --      Boys Diastolic  BP Percentile --      Pulse Rate 12/27/23 1354 81     Resp 12/27/23 1354 17     Temp 12/27/23 1354 98.5 F (36.9 C)     Temp Source 12/27/23 1354 Oral     SpO2 12/27/23 1354 98 %     Weight --      Height --      Head Circumference --      Peak Flow --      Pain Score 12/27/23 1353 4     Pain Loc --      Pain Education --      Exclude from Growth Chart --    No data found.  Updated Vital Signs BP 116/76 (BP Location: Left Arm)   Pulse 81   Temp 98.5 F (36.9 C) (Oral)   Resp 17   LMP 12/16/2023 (Exact Date)   SpO2 98%   Visual Acuity Right Eye Distance:   Left Eye Distance:   Bilateral Distance:    Right Eye Near:   Left Eye Near:    Bilateral Near:     Physical Exam Vitals and nursing note reviewed.  Constitutional:      General: She is  not in acute distress.    Appearance: Normal appearance. She is not ill-appearing.  HENT:     Head: Normocephalic and atraumatic.   Eyes:     Pupils: Pupils are equal, round, and reactive to light.    Cardiovascular:     Rate and Rhythm: Normal rate.  Pulmonary:     Effort: Pulmonary effort is normal.  Abdominal:     Comments:  there is a bellybutton piercing with some swelling/induration without fluctuance to the right aspect of the navel.  No active drainage.  No erythema warmth.  Area is tender to palpation.   Skin:    General: Skin is warm and dry.   Neurological:     General: No focal deficit present.     Mental Status: She is alert and oriented to person, place, and time.   Psychiatric:        Mood and Affect: Mood normal.        Behavior: Behavior normal.      UC Treatments / Results  Labs (all labs ordered are listed, but only abnormal results are displayed) Labs Reviewed - No data to display  EKG   Radiology No results found.  Procedures Procedures (including critical care time)  Medications Ordered in UC Medications - No data to display  Initial Impression / Assessment and Plan / UC Course  I have reviewed the triage vital signs and the nursing notes.  Pertinent labs & imaging results that were available during my care of the patient were reviewed by me and considered in my medical decision making (see chart for details).     Reviewed exam and symptoms with patient.  Advised that she should remove the piercing and that she should take the Bactrim  that has already been prescribed to her.  Patient verbalized understanding will follow-up with PCP if symptoms do not improve.  ER precautions reviewed. Final Clinical Impressions(s) / UC Diagnoses   Final diagnoses:  Pierced navel infection     Discharge Instructions      Take the Bactrim  that was prescribed to you earlier today.  Remove the piercing otherwise the infection may not resolve.   Follow-up with your PCP if symptoms do not improve.  ER for any worsening symptoms.    ED  Prescriptions   None    PDMP not reviewed this encounter.   Loreda Myla SAUNDERS, NP 12/27/23 1432

## 2023-12-27 NOTE — Discharge Instructions (Signed)
 Take the Bactrim  that was prescribed to you earlier today.  Remove the piercing otherwise the infection may not resolve.  Follow-up with your PCP if symptoms do not improve.  ER for any worsening symptoms.

## 2023-12-29 ENCOUNTER — Ambulatory Visit (INDEPENDENT_AMBULATORY_CARE_PROVIDER_SITE_OTHER): Admitting: Podiatry

## 2023-12-29 ENCOUNTER — Ambulatory Visit (INDEPENDENT_AMBULATORY_CARE_PROVIDER_SITE_OTHER)

## 2023-12-29 DIAGNOSIS — Q72899 Other reduction defects of unspecified lower limb: Secondary | ICD-10-CM

## 2023-12-29 DIAGNOSIS — M216X1 Other acquired deformities of right foot: Secondary | ICD-10-CM

## 2024-01-01 NOTE — Progress Notes (Signed)
 Subjective: Chief Complaint  Patient presents with   Routine Post Op    RM#14 POV patient states doing well no pain.     23 year old female presents the office today for the above concerns.  Patient been doing well.  The wound on the bottom of the fourth toe has been healing well.  She states that she has been walking the cam boot and is not causing significant pain.  She does not recall any injuries.  No fevers or chills.  No other concerns today.   Objective: AAO x3, NAD DP/PT pulses palpable bilaterally, CRT less than 3 seconds Incisions healing well.  There is also minimal scabbing noted along the incision.  On the plantar aspect of the fourth toe where she had the wound there is almost completely healed appears to be dry and clean today.  No erythema or warmth.  No drainage or pus.  Toe is rectus.  There is no pain to the surgical site today. No pain with calf compression, swelling, warmth, erythema  Assessment: Status post brachymetatarsia repair right fourth  Plan: -All treatment options discussed with the patient including all alternatives, risks, complications.  -X-rays obtained reviewed.  3 views were obtained.  Hardware intact with graft.  Appears to be in the same position as prior and there is some increased consolidation. - Discussed she can be partial weightbearing and she can start to gradually transition to walking in the cam boot as tolerated but there is any increase in swelling or discomfort to return.  Nonweightbearing, partial weightbearing. -Continue to ice and elevate -Monitor for any clinical signs or symptoms of infection and directed to call the office immediately should any occur or go to the ER.  Return in about 3 weeks (around 01/19/2024) for post-op, x-ray.  Donnice JONELLE Fees DPM

## 2024-01-03 ENCOUNTER — Telehealth: Payer: Self-pay | Admitting: Podiatry

## 2024-01-03 NOTE — Telephone Encounter (Signed)
 Patient states scab fell off and there is a stitch left. Does she need to come in before her scheduled appointment on 7/17 ?

## 2024-01-03 NOTE — Telephone Encounter (Signed)
 Patient will call office to schedule appt for Thursday or Friday .

## 2024-01-05 ENCOUNTER — Ambulatory Visit (INDEPENDENT_AMBULATORY_CARE_PROVIDER_SITE_OTHER): Admitting: Podiatry

## 2024-01-05 DIAGNOSIS — Q72899 Other reduction defects of unspecified lower limb: Secondary | ICD-10-CM

## 2024-01-06 ENCOUNTER — Ambulatory Visit
Admission: EM | Admit: 2024-01-06 | Discharge: 2024-01-06 | Disposition: A | Discharge status: Home / Self Care | Attending: Internal Medicine | Admitting: Internal Medicine

## 2024-01-06 ENCOUNTER — Encounter: Payer: Self-pay | Admitting: Emergency Medicine

## 2024-01-06 DIAGNOSIS — G8918 Other acute postprocedural pain: Secondary | ICD-10-CM

## 2024-01-06 DIAGNOSIS — M79671 Pain in right foot: Secondary | ICD-10-CM

## 2024-01-06 DIAGNOSIS — Q72899 Other reduction defects of unspecified lower limb: Secondary | ICD-10-CM | POA: Diagnosis not present

## 2024-01-06 MED ORDER — IBUPROFEN 800 MG PO TABS: 800.0000 mg | ORAL_TABLET | Freq: Three times a day (TID) | ORAL | 0 refills | Status: AC | PRN

## 2024-01-06 NOTE — ED Triage Notes (Signed)
 Pt c/o right foot pain. She had surgery about 1 month ago. She was told by podiatry yesterday she could bare weight and today she has started to have pain when putting pressure on foot.

## 2024-01-06 NOTE — Discharge Instructions (Addendum)
 Take ibuprofen  800mg  every 8 hours as needed for pain.  Follow-up with podiatry as scheduled.

## 2024-01-06 NOTE — ED Provider Notes (Signed)
 [facility]    CSN: [id number] Arrival date & time: 01/06/24  1732      History   Chief Complaint No chief complaint on file.   HPI Rebecca Gray is a 23 y.o. female.   Rebecca Gray is a 23 y.o. female presenting for chief complaint of right foot pain that started yesterday. She had surgery to the right fourth metatarsal performed by podiatrist on November 02, 2023 and has been compliant with following up with podiatry for post-op visits. She saw podiatry yesterday who gave her clearance to have full weightbearing activity on the right foot after having partial weightbearing activity for the last few weeks.  She got out of the shower yesterday and walked on her barefoot without a boot for the first time and started experiencing immediate pain to the surgical site. For the last 8 weeks, she has dried off her foot, applied her ace wrap, and placed her CAM boot on her foot immediately without bearing weight on the bare foot.  She has not noticed any drainage or dehiscence to the surgical site and continues to wrap the foot with ace wrap, sock, and cam boot.  Denies further/recent new injuries/trauma to the foot or paresthesias.   She took tylenol  and this helped with the pain significantly.      Past Medical History:  Diagnosis Date   Bacterial infection    in her blood   Medical history non-contributory     Patient Active Problem List   Diagnosis Date Noted   Cough 05/03/2022    History reviewed. No pertinent surgical history.  OB History   No obstetric history on file.      Home Medications    Prior to Admission medications   Medication Sig Start Date End Date Taking? Authorizing Provider  cephALEXin  (KEFLEX ) 500 MG capsule Take 1 capsule (500 mg total) by mouth 3 (three) times daily. Patient not taking: Reported on 12/15/2023 11/02/23   Gershon Donnice SAUNDERS, DPM  gabapentin  (NEURONTIN ) 100 MG capsule Take 1 capsule (100 mg total) by mouth 3 (three) times  daily. Patient not taking: Reported on 12/15/2023 12/01/23   Gershon Donnice SAUNDERS, DPM  ibuprofen  (ADVIL ) 800 MG tablet Take 1 tablet (800 mg total) by mouth every 8 (eight) hours as needed. 01/06/24   Enedelia Dorna HERO, FNP  oxyCODONE -acetaminophen  (PERCOCET/ROXICET) 5-325 MG tablet Take 1-2 tablets by mouth every 6 (six) hours as needed for severe pain (pain score 7-10). Patient not taking: Reported on 12/15/2023 11/07/23   Gershon Donnice SAUNDERS, DPM  promethazine  (PHENERGAN ) 25 MG tablet Take 1 tablet (25 mg total) by mouth every 8 (eight) hours as needed for nausea or vomiting. Patient not taking: Reported on 12/15/2023 11/02/23   Gershon Donnice SAUNDERS, DPM    Family History History reviewed. No pertinent family history.  Social History Social History   Tobacco Use   Smoking status: Never    Passive exposure: Yes   Smokeless tobacco: Never  Vaping Use   Vaping status: Never Used  Substance Use Topics   Alcohol use: Not Currently    Comment: occasional   Drug use: No     Allergies   Shellfish allergy   Review of Systems Review of Systems Per HPI  Physical Exam Triage Vital Signs ED Triage Vitals  Encounter Vitals Group     BP 01/06/24 1737 109/74     Girls Systolic BP Percentile --      Girls Diastolic BP Percentile --  Boys Systolic BP Percentile --      Boys Diastolic BP Percentile --      Pulse Rate 01/06/24 1737 82     Resp 01/06/24 1737 16     Temp 01/06/24 1737 97.9 F (36.6 C)     Temp Source 01/06/24 1737 Oral     SpO2 01/06/24 1737 97 %     Weight --      Height --      Head Circumference --      Peak Flow --      Pain Score 01/06/24 1741 7     Pain Loc --      Pain Education --      Exclude from Growth Chart --    No data found.  Updated Vital Signs BP 109/74 (BP Location: Right Arm)   Pulse 82   Temp 97.9 F (36.6 C) (Oral)   Resp 16   LMP 12/16/2023 (Exact Date)   SpO2 97%   Visual Acuity Right Eye Distance:   Left Eye Distance:    Bilateral Distance:    Right Eye Near:   Left Eye Near:    Bilateral Near:     Physical Exam Vitals and nursing note reviewed.  Constitutional:      Appearance: She is not ill-appearing or toxic-appearing.  HENT:     Head: Normocephalic and atraumatic.     Right Ear: Hearing and external ear normal.     Left Ear: Hearing and external ear normal.     Nose: Nose normal.     Mouth/Throat:     Lips: Pink.  Eyes:     General: Lids are normal. Vision grossly intact. Gaze aligned appropriately.     Extraocular Movements: Extraocular movements intact.     Conjunctiva/sclera: Conjunctivae normal.  Pulmonary:     Effort: Pulmonary effort is normal.  Musculoskeletal:     Cervical back: Neck supple.     Right ankle: Normal.     Right foot: Normal. Normal range of motion and normal capillary refill (less than 2, sensation intact distally). No swelling, deformity, bunion, prominent metatarsal heads, laceration, tenderness, bony tenderness or crepitus. Normal pulse (+2 right dorsalis pedis pulse).     Comments: CAM boot, ace wrap, and sock removed for exam. Surgical scar to the dorsum of the right foot over the fourth metatarsal is well approximated without wound dehiscence.  Non-tender to palpation.   Skin:    General: Skin is warm and dry.     Capillary Refill: Capillary refill takes less than 2 seconds.     Findings: No rash.  Neurological:     General: No focal deficit present.     Mental Status: She is alert and oriented to person, place, and time. Mental status is at baseline.     Cranial Nerves: No dysarthria or facial asymmetry.  Psychiatric:        Mood and Affect: Mood normal.        Speech: Speech normal.        Behavior: Behavior normal.        Thought Content: Thought content normal.        Judgment: Judgment normal.      UC Treatments / Results  Labs (all labs ordered are listed, but only abnormal results are displayed) Labs Reviewed - No data to  display  EKG   Radiology No results found.  Procedures Procedures (including critical care time)  Medications Ordered in UC Medications - No data to  display  Initial Impression / Assessment and Plan / UC Course  I have reviewed the triage vital signs and the nursing notes.  Pertinent labs & imaging results that were available during my care of the patient were reviewed by me and considered in my medical decision making (see chart for details).   1. Right foot pain, brachymetatarsia of fourth metatarsal bone, post-op pain Pain of the right foot after barefoot weightbearing activity for the first time in 8 weeks after right foot surgery. Exam shows intact surgical scar, no new injuries. Reviewed notes for previous visits with podiatry.  No new injuries, stable exam findings, therefore deferred repeat imaging of the right foot.  Recommend supportive care for symptomatic relief as outlined in AVS.  Ibuprofen  800mg  every 8 hours as needed for pain and inflammation. She returned to work 4 days ago and has been icing the foot consistently after working shifts.   Follow-up with podiatry as scheduled next week.  Counseled patient on potential for adverse effects with medications prescribed/recommended today, strict ER and return-to-clinic precautions discussed, patient verbalized understanding.   Final Clinical Impressions(s) / UC Diagnoses   Final diagnoses:  Brachymetatarsia of fourth metatarsal bone  Right foot pain  Post-operative pain     Discharge Instructions      Take ibuprofen  800mg  every 8 hours as needed for pain.  Follow-up with podiatry as scheduled.        ED Prescriptions     Medication Sig Dispense Auth. Provider   ibuprofen  (ADVIL ) 800 MG tablet Take 1 tablet (800 mg total) by mouth every 8 (eight) hours as needed. 20 tablet Enedelia Dorna HERO, FNP      PDMP not reviewed this encounter.   Enedelia Dorna HERO, [place] 01/06/24 1927

## 2024-01-16 NOTE — Progress Notes (Signed)
 Subjective: No chief complaint on file.     23 year old female presents the office today for the above concerns.  She presents today as the scab came off and she noticed a stitch still in the skin.  She did walk in the cam boot.  No fever or chills.  No drainage or open wounds.  No pain.   Objective: AAO x3, NAD DP/PT pulses palpable bilaterally, CRT less than 3 seconds Incisions healing well.  Scab is come off and scar is healing well.  The wound on the plantar is also almost completely healed at this time is dry.  There is 1 single stitch remaining on the incision (the tail had been cut but a piece remained). No pain with calf compression, swelling, warmth, erythema  Assessment: Status post brachymetatarsia repair right fourth  Plan: -All treatment options discussed with the patient including all alternatives, risks, complications.  -I remove the stitch today without any complications.  Continue daily dressing changes. Ice/elevation.  No follow-ups on file.  Rebecca Gray DPM

## 2024-01-19 ENCOUNTER — Ambulatory Visit: Admitting: Podiatry

## 2024-01-19 ENCOUNTER — Ambulatory Visit (INDEPENDENT_AMBULATORY_CARE_PROVIDER_SITE_OTHER)

## 2024-01-19 VITALS — Ht 64.0 in | Wt 140.0 lb

## 2024-01-19 DIAGNOSIS — Z9889 Other specified postprocedural states: Secondary | ICD-10-CM

## 2024-01-19 DIAGNOSIS — M79671 Pain in right foot: Secondary | ICD-10-CM | POA: Diagnosis not present

## 2024-01-19 DIAGNOSIS — Q72891 Other reduction defects of right lower limb: Secondary | ICD-10-CM

## 2024-01-20 NOTE — Progress Notes (Signed)
 Subjective: Chief Complaint  Patient presents with   Post-op Follow-up    Rm 11 Patient is here for post-op follow-up for right brachymetatarsia of the fourth metatarsal bone. Patient states no pain, but the right foot extra sensitive to temperature changes.     23 year old female presents the office today for the above concerns.  She says overall she is feeling better.  She states that her foot actually feels better without the boot and she has been trying to walk without the boot and it feels good.  No increase in swelling.  She states the first day after she stood up related her foot she had increased pain she was seen in urgent care that subsided.  No injuries that she reports.    Objective: AAO x3, NAD DP/PT pulses palpable bilaterally, CRT less than 3 seconds Incisions healing well.  Scars are well-formed.  There is no wounds present on the incision or the fourth toe.  There is slight edema present the foot there is no erythema or warmth.  There is no signs of infection.  There is no sign exam.  No other areas of discomfort.  No pain with calf compression, swelling, warmth, erythema  Assessment: Status post brachymetatarsia repair right fourth  Plan: -All treatment options discussed with the patient including all alternatives, risks, complications.  -X-rays obtained reviewed.  There does appear to be some increased consolidation across the graft site and hardware is intact without significant displacement compared to prior. -I discussed with her she can gradually start to transition to a regular supportive sneaker as tolerated.  Continue ice, elevate as well as compression to help any residual edema.  Return in about 4 weeks (around 02/16/2024) for post-op, x-ray.  Donnice JONELLE Fees DPM

## 2024-02-16 ENCOUNTER — Ambulatory Visit: Admitting: Podiatry

## 2024-02-23 ENCOUNTER — Ambulatory Visit: Admitting: Podiatry

## 2024-03-06 ENCOUNTER — Encounter: Payer: Self-pay | Admitting: Podiatry

## 2024-03-06 ENCOUNTER — Ambulatory Visit (INDEPENDENT_AMBULATORY_CARE_PROVIDER_SITE_OTHER): Admitting: Podiatry

## 2024-03-06 ENCOUNTER — Ambulatory Visit (INDEPENDENT_AMBULATORY_CARE_PROVIDER_SITE_OTHER)

## 2024-03-06 DIAGNOSIS — Q72891 Other reduction defects of right lower limb: Secondary | ICD-10-CM

## 2024-03-06 DIAGNOSIS — M7751 Other enthesopathy of right foot: Secondary | ICD-10-CM

## 2024-03-06 DIAGNOSIS — M7752 Other enthesopathy of left foot: Secondary | ICD-10-CM

## 2024-03-06 DIAGNOSIS — Q666 Other congenital valgus deformities of feet: Secondary | ICD-10-CM

## 2024-03-06 NOTE — Progress Notes (Signed)
 Subjective: Chief Complaint  Patient presents with   Routine Post Op    Rm14 Pov DOS 11/02/23 right foot brachymetatarsia 4th met/patient says she is doing well.    23 year old female presents the office today for the above concerns.  States that she been doing well.  She is back to wearing regular shoes and normal activity.  She states the only time it bothers her if she is on her feet too much.  No swelling or recent recent injuries.   Objective: AAO x3, NAD DP/PT pulses palpable bilaterally, CRT less than 3 seconds Incisions healing well.  Scars are well-formed.  There is no wounds present on the incision or the fourth toe.  Some slight edema is present but there is no erythema or warmth.  Not able to appreciate any area pinpoint tenderness.  There is mild decrease range of motion of the fourth MTPJ.  No discomfort on exam today.  Decreased medial arch height. No pain with calf compression, swelling, warmth, erythema  Assessment: Metatarsalgia right fourth, pes planovalgus  Plan: -All treatment options discussed with the patient including all alternatives, risks, complications.  -No surgical standpoint she is doing well.  Discussed increasing activity as tolerated.  I do think she will benefit from inserts to help decrease the pressure and offload the fourth digit.  She was measured for orthotics today.  Continue supportive shoe gear as tolerated. -Will plan for follow-up pending inserts.  Encouraged to call any questions or concerns or any changes in the meantime.  She has no further questions today.  Return for orthotic pick up.  Donnice JONELLE Fees DPM

## 2024-04-05 ENCOUNTER — Ambulatory Visit: Admitting: Podiatry

## 2024-04-10 ENCOUNTER — Telehealth: Payer: Self-pay

## 2024-04-10 NOTE — Telephone Encounter (Signed)
 Orthotics are here Balance $0 (ins covered) Financial form signed and on file Appt needed

## 2024-04-19 NOTE — Telephone Encounter (Signed)
 LVM to schedule orthotic fitting/ pu

## 2024-05-03 ENCOUNTER — Ambulatory Visit (INDEPENDENT_AMBULATORY_CARE_PROVIDER_SITE_OTHER): Admitting: Podiatry

## 2024-05-03 DIAGNOSIS — M7751 Other enthesopathy of right foot: Secondary | ICD-10-CM

## 2024-05-03 DIAGNOSIS — Q666 Other congenital valgus deformities of feet: Secondary | ICD-10-CM

## 2024-05-03 NOTE — Progress Notes (Signed)
 Patient presents today to pick up custom molded foot orthotics recommended by Dr. Donnice Fees.   Orthotics were dispensed and fit was satisfactory. Reviewed instructions for break-in and wear. Written instructions given to patient.  Patient will follow up as needed.   [facility] - order # [id number]

## 2024-05-03 NOTE — Patient Instructions (Signed)
